# Patient Record
Sex: Female | Born: 1975 | Hispanic: No | Marital: Married | Smoking: Never smoker
Health system: Southern US, Community
[De-identification: ages and names within clinical notes are randomized; demographics above are authoritative.]

## PROBLEM LIST (undated history)

## (undated) DIAGNOSIS — E119 Type 2 diabetes mellitus without complications: Secondary | ICD-10-CM

## (undated) DIAGNOSIS — I10 Essential (primary) hypertension: Secondary | ICD-10-CM

## (undated) HISTORY — DX: Essential (primary) hypertension: I10

---

## 2001-06-22 ENCOUNTER — Other Ambulatory Visit: Admission: RE | Admit: 2001-06-22 | Discharge: 2001-06-22 | Payer: Self-pay | Admitting: Obstetrics and Gynecology

## 2001-06-25 ENCOUNTER — Ambulatory Visit (HOSPITAL_COMMUNITY): Admission: RE | Admit: 2001-06-25 | Discharge: 2001-06-25 | Payer: Self-pay | Admitting: Obstetrics and Gynecology

## 2001-06-25 ENCOUNTER — Encounter: Payer: Self-pay | Admitting: Obstetrics and Gynecology

## 2001-07-12 ENCOUNTER — Inpatient Hospital Stay (HOSPITAL_COMMUNITY): Admission: RE | Admit: 2001-07-12 | Discharge: 2001-07-14 | Payer: Self-pay | Admitting: Obstetrics and Gynecology

## 2004-12-04 ENCOUNTER — Other Ambulatory Visit: Admission: RE | Admit: 2004-12-04 | Discharge: 2004-12-04 | Payer: Self-pay | Admitting: Family Medicine

## 2011-04-22 ENCOUNTER — Ambulatory Visit: Payer: Self-pay

## 2011-04-22 DIAGNOSIS — S52123A Displaced fracture of head of unspecified radius, initial encounter for closed fracture: Secondary | ICD-10-CM

## 2011-04-22 DIAGNOSIS — R112 Nausea with vomiting, unspecified: Secondary | ICD-10-CM

## 2011-04-30 ENCOUNTER — Ambulatory Visit: Payer: Self-pay

## 2011-04-30 DIAGNOSIS — S52123A Displaced fracture of head of unspecified radius, initial encounter for closed fracture: Secondary | ICD-10-CM

## 2016-04-25 ENCOUNTER — Other Ambulatory Visit (HOSPITAL_COMMUNITY): Payer: Self-pay | Admitting: Nurse Practitioner

## 2016-04-25 DIAGNOSIS — Z1231 Encounter for screening mammogram for malignant neoplasm of breast: Secondary | ICD-10-CM

## 2016-06-23 ENCOUNTER — Ambulatory Visit (HOSPITAL_COMMUNITY)
Admission: RE | Admit: 2016-06-23 | Discharge: 2016-06-23 | Disposition: A | Discharge status: Home / Self Care | Payer: Self-pay | Source: Ambulatory Visit | Attending: Nurse Practitioner | Admitting: Nurse Practitioner

## 2016-06-23 ENCOUNTER — Encounter (HOSPITAL_COMMUNITY): Payer: Self-pay | Admitting: Radiology

## 2016-06-23 DIAGNOSIS — Z1231 Encounter for screening mammogram for malignant neoplasm of breast: Secondary | ICD-10-CM

## 2017-09-10 ENCOUNTER — Other Ambulatory Visit: Payer: Self-pay | Admitting: Nurse Practitioner

## 2017-09-10 DIAGNOSIS — Z1231 Encounter for screening mammogram for malignant neoplasm of breast: Secondary | ICD-10-CM

## 2018-09-13 ENCOUNTER — Other Ambulatory Visit (HOSPITAL_COMMUNITY): Payer: Self-pay | Admitting: Nurse Practitioner

## 2018-09-13 DIAGNOSIS — Z1231 Encounter for screening mammogram for malignant neoplasm of breast: Secondary | ICD-10-CM

## 2018-11-22 ENCOUNTER — Encounter (HOSPITAL_COMMUNITY): Payer: Self-pay

## 2018-11-22 ENCOUNTER — Ambulatory Visit (HOSPITAL_COMMUNITY)
Admission: RE | Admit: 2018-11-22 | Discharge: 2018-11-22 | Disposition: A | Discharge status: Home / Self Care | Payer: Self-pay | Source: Ambulatory Visit | Attending: Nurse Practitioner | Admitting: Nurse Practitioner

## 2018-11-22 ENCOUNTER — Other Ambulatory Visit: Payer: Self-pay

## 2018-11-22 DIAGNOSIS — Z1231 Encounter for screening mammogram for malignant neoplasm of breast: Secondary | ICD-10-CM | POA: Insufficient documentation

## 2019-12-17 ENCOUNTER — Other Ambulatory Visit: Payer: Self-pay

## 2019-12-17 ENCOUNTER — Inpatient Hospital Stay (HOSPITAL_BASED_OUTPATIENT_CLINIC_OR_DEPARTMENT_OTHER)
Admission: EM | Admit: 2019-12-17 | Discharge: 2019-12-23 | DRG: 392 | Disposition: A | Discharge status: Home / Self Care | Payer: Self-pay | Attending: Surgery | Admitting: Surgery

## 2019-12-17 ENCOUNTER — Encounter (HOSPITAL_BASED_OUTPATIENT_CLINIC_OR_DEPARTMENT_OTHER): Payer: Self-pay | Admitting: Emergency Medicine

## 2019-12-17 ENCOUNTER — Emergency Department (HOSPITAL_BASED_OUTPATIENT_CLINIC_OR_DEPARTMENT_OTHER): Payer: Self-pay

## 2019-12-17 DIAGNOSIS — Z20822 Contact with and (suspected) exposure to covid-19: Secondary | ICD-10-CM | POA: Diagnosis present

## 2019-12-17 DIAGNOSIS — E876 Hypokalemia: Secondary | ICD-10-CM | POA: Diagnosis present

## 2019-12-17 DIAGNOSIS — Z833 Family history of diabetes mellitus: Secondary | ICD-10-CM

## 2019-12-17 DIAGNOSIS — R911 Solitary pulmonary nodule: Secondary | ICD-10-CM | POA: Diagnosis present

## 2019-12-17 DIAGNOSIS — K5900 Constipation, unspecified: Secondary | ICD-10-CM | POA: Diagnosis present

## 2019-12-17 DIAGNOSIS — K5720 Diverticulitis of large intestine with perforation and abscess without bleeding: Principal | ICD-10-CM | POA: Diagnosis present

## 2019-12-17 DIAGNOSIS — K5792 Diverticulitis of intestine, part unspecified, without perforation or abscess without bleeding: Secondary | ICD-10-CM | POA: Diagnosis present

## 2019-12-17 DIAGNOSIS — E11649 Type 2 diabetes mellitus with hypoglycemia without coma: Secondary | ICD-10-CM | POA: Diagnosis not present

## 2019-12-17 DIAGNOSIS — IMO0001 Reserved for inherently not codable concepts without codable children: Secondary | ICD-10-CM | POA: Insufficient documentation

## 2019-12-17 DIAGNOSIS — Z7984 Long term (current) use of oral hypoglycemic drugs: Secondary | ICD-10-CM

## 2019-12-17 DIAGNOSIS — Z79899 Other long term (current) drug therapy: Secondary | ICD-10-CM

## 2019-12-17 HISTORY — DX: Type 2 diabetes mellitus without complications: E11.9

## 2019-12-17 LAB — COMPREHENSIVE METABOLIC PANEL
ALT: 13 U/L (ref 0–44)
AST: 11 U/L — ABNORMAL LOW (ref 15–41)
Albumin: 3.5 g/dL (ref 3.5–5.0)
Alkaline Phosphatase: 51 U/L (ref 38–126)
Anion gap: 9 (ref 5–15)
BUN: 12 mg/dL (ref 6–20)
CO2: 23 mmol/L (ref 22–32)
Calcium: 10.6 mg/dL — ABNORMAL HIGH (ref 8.9–10.3)
Chloride: 104 mmol/L (ref 98–111)
Creatinine, Ser: 0.69 mg/dL (ref 0.44–1.00)
GFR calc Af Amer: >60 mL/min (ref >60)
GFR calc non Af Amer: >60 mL/min (ref >60)
Glucose, Bld: 135 mg/dL — ABNORMAL HIGH (ref 70–99)
Potassium: 3.4 mmol/L — ABNORMAL LOW (ref 3.5–5.1)
Sodium: 136 mmol/L (ref 135–145)
Total Bilirubin: 0.4 mg/dL (ref 0.3–1.2)
Total Protein: 7.1 g/dL (ref 6.5–8.1)

## 2019-12-17 LAB — CBC
HCT: 38.1 % (ref 36.0–46.0)
Hemoglobin: 12.7 g/dL (ref 12.0–15.0)
MCH: 29.7 pg (ref 26.0–34.0)
MCHC: 33.3 g/dL (ref 30.0–36.0)
MCV: 89 fL (ref 80.0–100.0)
Platelets: 257 10*3/uL (ref 150–400)
RBC: 4.28 MIL/uL (ref 3.87–5.11)
RDW: 12.2 % (ref 11.5–15.5)
WBC: 7.4 10*3/uL (ref 4.0–10.5)
nRBC: 0 % (ref 0.0–0.2)

## 2019-12-17 LAB — URINALYSIS, ROUTINE W REFLEX MICROSCOPIC
Bilirubin Urine: NEGATIVE
Glucose, UA: NEGATIVE mg/dL
Ketones, ur: NEGATIVE mg/dL
Nitrite: NEGATIVE
Protein, ur: NEGATIVE mg/dL
Specific Gravity, Urine: 1.015 (ref 1.005–1.030)
pH: 7 (ref 5.0–8.0)

## 2019-12-17 LAB — SARS CORONAVIRUS 2 BY RT PCR (HOSPITAL ORDER, PERFORMED IN ~~LOC~~ HOSPITAL LAB): SARS Coronavirus 2: NEGATIVE

## 2019-12-17 LAB — HEMOGLOBIN A1C
Hgb A1c MFr Bld: 5.8 % — ABNORMAL HIGH (ref 4.8–5.6)
Mean Plasma Glucose: 119.76 mg/dL

## 2019-12-17 LAB — URINALYSIS, MICROSCOPIC (REFLEX)

## 2019-12-17 LAB — GLUCOSE, CAPILLARY
Glucose-Capillary: 146 mg/dL — ABNORMAL HIGH (ref 70–99)
Glucose-Capillary: 202 mg/dL — ABNORMAL HIGH (ref 70–99)
Glucose-Capillary: 100 mg/dL — ABNORMAL HIGH (ref 70–99)

## 2019-12-17 LAB — PREGNANCY, URINE: Preg Test, Ur: NEGATIVE

## 2019-12-17 LAB — LIPASE, BLOOD: Lipase: 22 U/L (ref 11–51)

## 2019-12-17 LAB — CBG MONITORING, ED: Glucose-Capillary: 122 mg/dL — ABNORMAL HIGH (ref 70–99)

## 2019-12-17 LAB — HIV ANTIBODY (ROUTINE TESTING W REFLEX): HIV Screen 4th Generation wRfx: NONREACTIVE

## 2019-12-17 MED ORDER — IOHEXOL 300 MG/ML  SOLN
100.0000 mL | Freq: Once | INTRAMUSCULAR | Status: AC | PRN
  Administered 2019-12-17: 100 mL via INTRAVENOUS

## 2019-12-17 MED ORDER — INSULIN DETEMIR 100 UNIT/ML ~~LOC~~ SOLN
5.0000 [IU] | Freq: Every day | SUBCUTANEOUS | Status: DC
  Administered 2019-12-17 – 2019-12-22 (×4): 5 [IU] via SUBCUTANEOUS
  Filled 2019-12-17 (×6): qty 0.05

## 2019-12-17 MED ORDER — ERTAPENEM SODIUM 1 G IJ SOLR: 1.0000 g | INTRAMUSCULAR | Status: DC

## 2019-12-17 MED ORDER — SODIUM CHLORIDE 0.9 % IV BOLUS
1000.0000 mL | Freq: Once | INTRAVENOUS | Status: AC
  Administered 2019-12-17: 1000 mL via INTRAVENOUS

## 2019-12-17 MED ORDER — ACETAMINOPHEN 650 MG RE SUPP: 650.0000 mg | Freq: Four times a day (QID) | RECTAL | Status: DC | PRN

## 2019-12-17 MED ORDER — ENOXAPARIN SODIUM 40 MG/0.4ML ~~LOC~~ SOLN: 40.0000 mg | SUBCUTANEOUS | Status: DC

## 2019-12-17 MED ORDER — ONDANSETRON HCL 4 MG/2ML IJ SOLN
4.0000 mg | Freq: Once | INTRAMUSCULAR | Status: AC
  Administered 2019-12-17: 4 mg via INTRAVENOUS
  Filled 2019-12-17: qty 2

## 2019-12-17 MED ORDER — ONDANSETRON HCL 4 MG PO TABS: 4.0000 mg | ORAL_TABLET | Freq: Four times a day (QID) | ORAL | Status: DC | PRN

## 2019-12-17 MED ORDER — ACETAMINOPHEN 325 MG PO TABS
650.0000 mg | ORAL_TABLET | Freq: Four times a day (QID) | ORAL | Status: DC | PRN
  Administered 2019-12-17 – 2019-12-20 (×3): 650 mg via ORAL
  Filled 2019-12-17 (×3): qty 2

## 2019-12-17 MED ORDER — MORPHINE SULFATE (PF) 4 MG/ML IV SOLN
4.0000 mg | Freq: Once | INTRAVENOUS | Status: AC
  Administered 2019-12-17: 4 mg via INTRAVENOUS
  Filled 2019-12-17: qty 1

## 2019-12-17 MED ORDER — ONDANSETRON HCL 4 MG/2ML IJ SOLN: 4.0000 mg | Freq: Four times a day (QID) | INTRAMUSCULAR | Status: DC | PRN

## 2019-12-17 MED ORDER — METRONIDAZOLE IN NACL 5-0.79 MG/ML-% IV SOLN
500.0000 mg | Freq: Once | INTRAVENOUS | Status: AC
  Administered 2019-12-17: 500 mg via INTRAVENOUS
  Filled 2019-12-17: qty 100

## 2019-12-17 MED ORDER — PNEUMOCOCCAL VAC POLYVALENT 25 MCG/0.5ML IJ INJ
0.5000 mL | INJECTION | INTRAMUSCULAR | Status: DC
  Filled 2019-12-17: qty 0.5

## 2019-12-17 MED ORDER — SODIUM CHLORIDE 0.9 % IV BOLUS
2000.0000 mL | Freq: Once | INTRAVENOUS | Status: AC
  Administered 2019-12-17: 11:00:00 2000 mL via INTRAVENOUS

## 2019-12-17 MED ORDER — HYDROMORPHONE HCL 1 MG/ML IJ SOLN
1.0000 mg | INTRAMUSCULAR | Status: DC | PRN
  Administered 2019-12-17: 11:00:00 1 mg via INTRAVENOUS
  Administered 2019-12-17: 19:00:00 0.5 mg via INTRAVENOUS
  Administered 2019-12-18 – 2019-12-19 (×5): 1 mg via INTRAVENOUS
  Filled 2019-12-17 (×8): qty 1

## 2019-12-17 MED ORDER — INSULIN ASPART 100 UNIT/ML ~~LOC~~ SOLN
0.0000 [IU] | Freq: Three times a day (TID) | SUBCUTANEOUS | Status: DC
  Administered 2019-12-17: 1 [IU] via SUBCUTANEOUS
  Administered 2019-12-17: 3 [IU] via SUBCUTANEOUS
  Administered 2019-12-18: 12:00:00 2 [IU] via SUBCUTANEOUS
  Administered 2019-12-18: 09:00:00 1 [IU] via SUBCUTANEOUS
  Administered 2019-12-18 – 2019-12-22 (×2): 2 [IU] via SUBCUTANEOUS

## 2019-12-17 MED ORDER — SODIUM CHLORIDE 0.9 % IV SOLN
1.0000 g | INTRAVENOUS | Status: DC
  Administered 2019-12-17 – 2019-12-18 (×2): 1000 mg via INTRAVENOUS
  Filled 2019-12-17 (×3): qty 1

## 2019-12-17 MED ORDER — INSULIN ASPART 100 UNIT/ML ~~LOC~~ SOLN
3.0000 [IU] | Freq: Three times a day (TID) | SUBCUTANEOUS | Status: DC
  Administered 2019-12-18 (×3): 3 [IU] via SUBCUTANEOUS

## 2019-12-17 MED ORDER — KCL IN DEXTROSE-NACL 30-5-0.45 MEQ/L-%-% IV SOLN
INTRAVENOUS | Status: AC
  Administered 2019-12-17: 13:00:00 100 mL/h via INTRAVENOUS
  Filled 2019-12-17 (×2): qty 1000

## 2019-12-17 MED ORDER — CIPROFLOXACIN IN D5W 400 MG/200ML IV SOLN
400.0000 mg | Freq: Once | INTRAVENOUS | Status: AC
  Administered 2019-12-17: 400 mg via INTRAVENOUS
  Filled 2019-12-17: qty 200

## 2019-12-17 NOTE — ED Provider Notes (Signed)
[facility] Provider Note   CSN: [id number] Arrival date & time: 12/17/19  0159   History Chief Complaint  Patient presents with  . Abdominal Pain    Tiffany Stevenson is a 44 y.o. female.  The history is provided by the patient.  Abdominal Pain She has history of diabetes and comes in complaining of abdominal pain for the last 2 days.  Pain is predominantly right-sided and without radiation.  She rates it at 9/10.  Nothing makes it better, nothing makes it worse.  She denies nausea or vomiting but has had anorexia.  She denies any urinary difficulty.  She has been generally constipated and has been taking MiraLAX without any benefit.  She denies fever, chills, sweats.  Past Medical History:  Diagnosis Date  . Diabetes mellitus without complication ([place])     There are no problems to display for this patient.   History reviewed. No pertinent surgical history.   OB History   No obstetric history on file.     No family history on file.  Social History   Tobacco Use  . Smoking status: Never Smoker  . Smokeless tobacco: Never Used  Substance Use Topics  . Alcohol use: Yes    Comment: occasionally  . Drug use: Never    Home Medications Prior to Admission medications   Medication Sig Start Date End Date Taking? Authorizing Provider  lisinopril (ZESTRIL) 10 MG tablet Take 10 mg by mouth daily.   Yes [provider]  metFORMIN (GLUCOPHAGE) 500 MG tablet Take 500 mg by mouth 2 (two) times daily with a meal.   Yes [provider]    Allergies    Patient has no known allergies.  Review of Systems   Review of Systems  Gastrointestinal: Positive for abdominal pain.  All other systems reviewed and are negative.   Physical Exam Updated Vital Signs BP 121/63 (BP Location: Right Arm)   Pulse 89   Temp 98.4 F (36.9 C) (Oral)   Resp (!) 22   Ht 5\' 4"  (1.626 m)   Wt 64.9 kg   SpO2 99%   BMI 24.55 kg/m    Physical Exam Vitals and nursing note reviewed.   44 year old female, appears uncomfortable, but is in no acute distress. Vital signs are significant for slightly elevated respiratory rate. Oxygen saturation is 99%, which is normal. Head is normocephalic and atraumatic. PERRLA, EOMI. Oropharynx is clear. Neck is nontender and supple without adenopathy or JVD. Back is nontender and there is no CVA tenderness. Lungs are clear without rales, wheezes, or rhonchi. Chest is nontender. Heart has regular rate and rhythm without murmur. Abdomen is soft, flat, with moderate epigastric and right lower quadrant and suprapubic tenderness.  There is no rebound or guarding.  There are no masses or hepatosplenomegaly and peristalsis is hypoactive. Extremities have no cyanosis or edema, full range of motion is present. Skin is warm and dry without rash. Neurologic: Mental status is normal, cranial nerves are intact, there are no motor or sensory deficits.  ED Results / Procedures / Treatments   Labs (all labs ordered are listed, but only abnormal results are displayed) Labs Reviewed  COMPREHENSIVE METABOLIC PANEL - Abnormal; Notable for the following components:      Result Value   Potassium 3.4 (*)    Glucose, Bld 135 (*)    Calcium 10.6 (*)    AST 11 (*)    All other components within normal limits  URINALYSIS,  ROUTINE W REFLEX MICROSCOPIC - Abnormal; Notable for the following components:   APPearance CLOUDY (*)    Hgb urine dipstick MODERATE (*)    Leukocytes,Ua SMALL (*)    All other components within normal limits  URINALYSIS, MICROSCOPIC (REFLEX) - Abnormal; Notable for the following components:   Bacteria, UA FEW (*)    All other components within normal limits  CBG MONITORING, ED - Abnormal; Notable for the following components:   Glucose-Capillary 122 (*)    All other components within normal limits  LIPASE, BLOOD  CBC  PREGNANCY, URINE   Radiology CT ABDOMEN PELVIS W  CONTRAST  Result Date: 12/17/2019 CLINICAL DATA:  Initial evaluation for acute right lower quadrant abdominal pain. EXAM: CT ABDOMEN AND PELVIS WITH CONTRAST TECHNIQUE: Multidetector CT imaging of the abdomen and pelvis was performed using the standard protocol following bolus administration of intravenous contrast. CONTRAST:  169mL OMNIPAQUE IOHEXOL 300 MG/ML  SOLN COMPARISON:  None available. FINDINGS: Lower chest: 3 mm nodule partially visualized at the left lower lobe (series 3, image 1), indeterminate. Visualized lungs are otherwise clear. Hepatobiliary: Liver within normal limits. Focal fat deposition noted adjacent to the falciform ligament. Gallbladder normal. No biliary dilatation. Pancreas: Pancreas within normal limits. Spleen: Spleen within normal limits. Adrenals/Urinary Tract: Adrenal glands are normal. Kidneys equal size with symmetric enhancement. No nephrolithiasis, hydronephrosis, or focal enhancing renal mass. No visible hydroureter. Partially distended bladder within normal limits. Stomach/Bowel: Stomach within normal limits. No evidence for bowel obstruction. Appendix well visualized within the right lower quadrant and is within normal limits for caliber without evidence for acute appendicitis. Multiple scattered colonic diverticula seen, most pronounced at the sigmoid colon. Associated wall thickening with inflammatory stranding about the sigmoid colon consistent with acute diverticulitis. Associated inflammatory stranding throughout the pericolonic and omental fat noted within the lower abdomen. Small volume free fluid within the pelvis, likely reactive. Few locules of free air seen within the lower abdomen/pelvis along the midline (series 2, image 71, 65 - series 5, image 22 on coronal sequence), suggestive of a micro perforation. Site of perforation not definitely seen. Vascular/Lymphatic: Normal intravascular enhancement seen throughout the intra-abdominal aorta. Mesenteric vessels patent  proximally. No pathologically enlarged intra-abdominopelvic lymph nodes. Reproductive: IUD in appropriate position within the uterus. Mild prominence of the parametrial vasculature noted. Ovaries within normal limits for age. Other: Small fat containing paraumbilical hernia noted without associated inflammation. Musculoskeletal: External soft tissues within normal limits. No acute osseous abnormality. No discrete or worrisome osseous lesions. IMPRESSION: 1. Findings consistent with acute sigmoid diverticulitis. Few locules of free air within the lower abdomen/pelvis consistent with associated micro perforation. 2. No other acute intra-abdominal or pelvic process identified. Normal appendix. 3. 3 mm left lower lobe pulmonary nodule, indeterminate. No follow-up needed if patient is low-risk. Non-contrast chest CT can be considered in 12 months if patient is high-risk. This recommendation follows the consensus statement: Guidelines for Management of Incidental Pulmonary Nodules Detected on CT Images: From the Fleischner Society 2017; Radiology 2017; 284:228-243. Critical Value/emergent results were called by telephone at the time of interpretation on 12/17/2019 at 5:13 am to provider Rukiya Hodgkins [facility] , who verbally acknowledged these results. Electronically Signed   By: Jeannine Boga M.D.   On: 12/17/2019 05:15    Procedures Procedures   Medications Ordered in ED Medications  ciprofloxacin (CIPRO) IVPB 400 mg (has no administration in time range)  metroNIDAZOLE (FLAGYL) IVPB 500 mg (has no administration in time range)  morphine 4 MG/ML injection 4 mg (4  mg Intravenous Given 12/17/19 0433)  ondansetron (ZOFRAN) injection 4 mg (4 mg Intravenous Given 12/17/19 0433)  sodium chloride 0.9 % bolus 1,000 mL (1,000 mLs Intravenous New Bag/Given 12/17/19 0500)  iohexol (OMNIPAQUE) 300 MG/ML solution 100 mL (100 mLs Intravenous Contrast Given 12/17/19 0440)    ED Course  I have reviewed the triage vital signs and  the nursing notes.  Pertinent labs & imaging results that were available during my care of the patient were reviewed by me and considered in my medical decision making (see chart for details).  MDM Rules/Calculators/A&P Abdominal pain of uncertain cause.  Differential is broad and does include conditions were significant morbidity and mortality.  Differential includes diverticulitis, appendicitis, pancreatitis, pain from constipation.  Screening labs are unremarkable.  Old records are reviewed, and she has no relevant past visits.  Will send for CT of abdomen and pelvis to rule out serious pathology.  CT scan shows sigmoid diverticulitis with microperforation.  I have independently viewed the CT images and discussed the findings with radiologist.  She is started on ciprofloxacin and metronidazole.  Also, incidental finding of left lower lobe lung nodule.  Case is discussed with Dr. Myna Hidalgo of [facility], who agrees to admit the patient.  Final Clinical Impression(s) / ED Diagnoses Final diagnoses:  Perforation of sigmoid colon due to diverticulitis  Nodule of lower lobe of left lung    Rx / DC Orders ED Discharge Orders    None       Delora Fuel, MD 85/27/78 [phone]

## 2019-12-17 NOTE — ED Triage Notes (Signed)
Reports diffuse abdominal pain since yesterday.  Endorses difficulty having a bm.  Last one a week or so ago.  Denies any n/v.

## 2019-12-17 NOTE — H&P (Addendum)
History and Physical  Tiffany Stevenson BMW:[id number] DOB: [birth year] DOA: 12/17/2019  PCP: Karma Ganja, NP Patient coming from: Home  I have personally briefly reviewed patient's old medical records in [place]   Chief Complaint: abd pain nause  HPI: Tiffany Stevenson is a 44 y.o. female past medical history of diabetes mellitus with an unknown hemoglobin A1c, is in complaining of abdominal pain that started 2 days prior to admission mainly in the right side without radiation, on my interview she relates it is more periumbilical with nausea nothing makes it better or worse.  She relates her abdominal pain is constant, denies any fever at home, urinary difficulties, or vaginal discharge she is anorexic.  She denies any fever chills vomiting or diarrhea.  In the ED: To be afebrile with vital stable is mildly hypokalemic UA showing she had a white blood cells, CT scan showed diverticulitis with microperforation.   Review of Systems: All systems reviewed and apart from history of presenting illness, are negative.  Past Medical History:  Diagnosis Date  . Diabetes mellitus without complication ([place])    History reviewed. No pertinent surgical history. Social History:  reports that she has never smoked. She has never used smokeless tobacco. She reports previous alcohol use. She reports that she does not use drugs.   No Known Allergies  Family History  Problem Relation Age of Onset  . Diabetes Mother   . Diabetes Father       Prior to Admission medications   Medication Sig Start Date End Date Taking? Authorizing Provider  lisinopril (ZESTRIL) 10 MG tablet Take 10 mg by mouth daily.   Yes [provider]  metFORMIN (GLUCOPHAGE) 500 MG tablet Take 500 mg by mouth 2 (two) times daily with a meal.   Yes [provider]   Physical Exam: Vitals:   12/17/19 0359 12/17/19 0546 12/17/19 0600 12/17/19 0850  BP: 121/63 116/67 114/61 (!) 105/53    Pulse: 89 98 88 87  Resp: (!) 22 18 18 15   Temp: 98.4 F (36.9 C)   99.2 F (37.3 C)  TempSrc: Oral   Oral  SpO2: 99% 100% 100% 98%  Weight:      Height:         General exam: Moderately built and nourished patient, lying comfortably supine on the gurney in no obvious distress.  Head, eyes and ENT: Nontraumatic and normocephalic. Pupils equally reacting to light and accommodation. Oral mucosa moist.  Neck: Supple. No JVD, carotid bruit or thyromegaly.  Lymphatics: No lymphadenopathy.  Respiratory system: Clear to auscultation. No increased work of breathing.  Cardiovascular system: S1 and S2 heard, RRR. No JVD, murmurs, gallops, clicks or pedal edema.  Gastrointestinal system: Positive bowel sounds soft diffuse tenderness but mainly in the hydrant no rebound or guarding.  Central nervous system: Alert and oriented. No focal neurological deficits.  Extremities: Symmetric 5 x 5 power. Peripheral pulses symmetrically felt.   Skin: No rashes or acute findings.  Musculoskeletal system: Negative exam.  Psychiatry: Pleasant and cooperative.   Labs on Admission:  Basic Metabolic Panel: Recent Labs  Lab 12/17/19 0219  NA 136  K 3.4*  CL 104  CO2 23  GLUCOSE 135*  BUN 12  CREATININE 0.69  CALCIUM 10.6*   Liver Function Tests: Recent Labs  Lab 12/17/19 0219  AST 11*  ALT 13  ALKPHOS 51  BILITOT 0.4  PROT 7.1  ALBUMIN 3.5   Recent Labs  Lab 12/17/19 0219  LIPASE 22   No results for input(s): AMMONIA in the last 168 hours. CBC: Recent Labs  Lab 12/17/19 0219  WBC 7.4  HGB 12.7  HCT 38.1  MCV 89.0  PLT 257   Cardiac Enzymes: No results for input(s): CKTOTAL, CKMB, CKMBINDEX, TROPONINI in the last 168 hours.  BNP (last 3 results) No results for input(s): PROBNP in the last 8760 hours. CBG: Recent Labs  Lab 12/17/19 0226  GLUCAP 122*    Radiological Exams on Admission: CT ABDOMEN PELVIS W CONTRAST  Result Date: 12/17/2019 CLINICAL DATA:   Initial evaluation for acute right lower quadrant abdominal pain. EXAM: CT ABDOMEN AND PELVIS WITH CONTRAST TECHNIQUE: Multidetector CT imaging of the abdomen and pelvis was performed using the standard protocol following bolus administration of intravenous contrast. CONTRAST:  131mL OMNIPAQUE IOHEXOL 300 MG/ML  SOLN COMPARISON:  None available. FINDINGS: Lower chest: 3 mm nodule partially visualized at the left lower lobe (series 3, image 1), indeterminate. Visualized lungs are otherwise clear. Hepatobiliary: Liver within normal limits. Focal fat deposition noted adjacent to the falciform ligament. Gallbladder normal. No biliary dilatation. Pancreas: Pancreas within normal limits. Spleen: Spleen within normal limits. Adrenals/Urinary Tract: Adrenal glands are normal. Kidneys equal size with symmetric enhancement. No nephrolithiasis, hydronephrosis, or focal enhancing renal mass. No visible hydroureter. Partially distended bladder within normal limits. Stomach/Bowel: Stomach within normal limits. No evidence for bowel obstruction. Appendix well visualized within the right lower quadrant and is within normal limits for caliber without evidence for acute appendicitis. Multiple scattered colonic diverticula seen, most pronounced at the sigmoid colon. Associated wall thickening with inflammatory stranding about the sigmoid colon consistent with acute diverticulitis. Associated inflammatory stranding throughout the pericolonic and omental fat noted within the lower abdomen. Small volume free fluid within the pelvis, likely reactive. Few locules of free air seen within the lower abdomen/pelvis along the midline (series 2, image 71, 65 - series 5, image 22 on coronal sequence), suggestive of a micro perforation. Site of perforation not definitely seen. Vascular/Lymphatic: Normal intravascular enhancement seen throughout the intra-abdominal aorta. Mesenteric vessels patent proximally. No pathologically enlarged  intra-abdominopelvic lymph nodes. Reproductive: IUD in appropriate position within the uterus. Mild prominence of the parametrial vasculature noted. Ovaries within normal limits for age. Other: Small fat containing paraumbilical hernia noted without associated inflammation. Musculoskeletal: External soft tissues within normal limits. No acute osseous abnormality. No discrete or worrisome osseous lesions. IMPRESSION: 1. Findings consistent with acute sigmoid diverticulitis. Few locules of free air within the lower abdomen/pelvis consistent with associated micro perforation. 2. No other acute intra-abdominal or pelvic process identified. Normal appendix. 3. 3 mm left lower lobe pulmonary nodule, indeterminate. No follow-up needed if patient is low-risk. Non-contrast chest CT can be considered in 12 months if patient is high-risk. This recommendation follows the consensus statement: Guidelines for Management of Incidental Pulmonary Nodules Detected on CT Images: From the Fleischner Society 2017; Radiology 2017; 284:228-243. Critical Value/emergent results were called by telephone at the time of interpretation on 12/17/2019 at 5:13 am to provider DAVID [facility] , who verbally acknowledged these results. Electronically Signed   By: Jeannine Boga M.D.   On: 12/17/2019 05:15    EKG: Independently reviewed.  None  Assessment/Plan Active Problems:   Acute diverticulitis Her clinical picture is deceiving, as she has a very significant diverticulitis with microperforation on CT scan with significant abdominal pain, but she is afebrile not tachycardic and other vital signs are stable. I will go ahead and start her on Invanz, we will  hydrate her aggressively we will give her 2 L of normal saline and then continue D5 half-normal saline infusion, she is in extreme pain. We will start her on IV hydromorphone, if her symptomatology does not improve over the next several days we will have to repeat a CT scan. At this  point in time she is thirsty and not hungry will allow her clear liquid diet. We will monitor CBCs and basic metabolic panel closely.  Pulmonary nodule 3 mm: She will need further evaluation in 4 to 6 weeks with PCP to review the CT scan of the chest.    DVT Prophylaxis: lovenox Code Status: full  Family Communication: Daughter-in-law Disposition Plan: inpatient      It is my clinical opinion that admission to INPATIENT is reasonable and necessary in this 44 y.o. female diabetes mellitus comes in with acute diverticulitis with severe abdominal pain and nausea not able to tolerate anything by mouth, started on IV fluids and IV Invanz.  Given the aforementioned, the predictability of an adverse outcome is felt to be significant. I expect that the patient will require at least 2 midnights in the hospital to treat this condition.  Charlynne Cousins MD [facility]   12/17/2019, 10:19 AM

## 2019-12-18 LAB — CBC
HCT: 31.3 % — ABNORMAL LOW (ref 36.0–46.0)
Hemoglobin: 10.1 g/dL — ABNORMAL LOW (ref 12.0–15.0)
MCH: 30.3 pg (ref 26.0–34.0)
MCHC: 32.3 g/dL (ref 30.0–36.0)
MCV: 94 fL (ref 80.0–100.0)
Platelets: 188 10*3/uL (ref 150–400)
RBC: 3.33 MIL/uL — ABNORMAL LOW (ref 3.87–5.11)
RDW: 12.5 % (ref 11.5–15.5)
WBC: 11.5 10*3/uL — ABNORMAL HIGH (ref 4.0–10.5)
nRBC: 0 % (ref 0.0–0.2)

## 2019-12-18 LAB — BASIC METABOLIC PANEL
Anion gap: 6 (ref 5–15)
BUN: <5 mg/dL — ABNORMAL LOW (ref 6–20)
CO2: 20 mmol/L — ABNORMAL LOW (ref 22–32)
Calcium: 9.1 mg/dL (ref 8.9–10.3)
Chloride: 109 mmol/L (ref 98–111)
Creatinine, Ser: 0.55 mg/dL (ref 0.44–1.00)
GFR calc Af Amer: >60 mL/min (ref >60)
GFR calc non Af Amer: >60 mL/min (ref >60)
Glucose, Bld: 156 mg/dL — ABNORMAL HIGH (ref 70–99)
Potassium: 4.1 mmol/L (ref 3.5–5.1)
Sodium: 135 mmol/L (ref 135–145)

## 2019-12-18 LAB — GLUCOSE, CAPILLARY
Glucose-Capillary: 140 mg/dL — ABNORMAL HIGH (ref 70–99)
Glucose-Capillary: 162 mg/dL — ABNORMAL HIGH (ref 70–99)
Glucose-Capillary: 190 mg/dL — ABNORMAL HIGH (ref 70–99)
Glucose-Capillary: 144 mg/dL — ABNORMAL HIGH (ref 70–99)

## 2019-12-18 MED ORDER — SODIUM CHLORIDE 0.9 % IV SOLN: INTRAVENOUS | Status: DC

## 2019-12-18 MED ORDER — KCL IN DEXTROSE-NACL 30-5-0.45 MEQ/L-%-% IV SOLN
INTRAVENOUS | Status: DC
  Filled 2019-12-18: qty 1000

## 2019-12-18 MED ORDER — ENOXAPARIN SODIUM 40 MG/0.4ML ~~LOC~~ SOLN
40.0000 mg | Freq: Every day | SUBCUTANEOUS | Status: DC
  Administered 2019-12-18 – 2019-12-22 (×5): 40 mg via SUBCUTANEOUS
  Filled 2019-12-18 (×4): qty 0.4

## 2019-12-18 NOTE — Consult Note (Signed)
Reason for Consult:diverticulitis with microperf Referring Physician: Dr. Angela Burke Tearah Saulsbury is an 44 y.o. female.  HPI: This is a 44 year old female with no prior history of diverticulitis who presents with a several day history of right lower quadrant abdominal pain.  She reports that the pain came on gradually and is now sharp and moderate.  She presented to the emergency department.  She was found on CT scan to have findings consistent with sigmoid diverticulitis and a microperforation.  There were a few locules of free air but no abscess.  She reports that she has been constipated.  She denies fevers or chills.  She denies dysuria or hematuria.  Past Medical History:  Diagnosis Date  . Diabetes mellitus without complication ([place])     History reviewed. No pertinent surgical history.  Family History  Problem Relation Age of Onset  . Diabetes Mother   . Diabetes Father     Social History:  reports that she has never smoked. She has never used smokeless tobacco. She reports previous alcohol use. She reports that she does not use drugs.  Allergies: No Known Allergies  Medications: I have reviewed the patient's current medications.  Results for orders placed or performed during the hospital encounter of 12/17/19 (from the past 48 hour(s))  Lipase, blood     Status: None   Collection Time: 12/17/19  2:19 AM  Result Value Ref Range   Lipase 22 11 - 51 U/L    Comment: Performed at [facility], [address] [place], [place] [address]  Comprehensive metabolic panel     Status: Abnormal   Collection Time: 12/17/19  2:19 AM  Result Value Ref Range   Sodium 136 135 - 145 mmol/L   Potassium 3.4 (L) 3.5 - 5.1 mmol/L   Chloride 104 98 - 111 mmol/L   CO2 23 22 - 32 mmol/L   Glucose, Bld 135 (H) 70 - 99 mg/dL    Comment: Glucose reference range applies only to samples taken after fasting for at least 8 hours.   BUN 12 6 - 20 mg/dL   Creatinine, Ser 0.69 0.44  - 1.00 mg/dL   Calcium 10.6 (H) 8.9 - 10.3 mg/dL   Total Protein 7.1 6.5 - 8.1 g/dL   Albumin 3.5 3.5 - 5.0 g/dL   AST 11 (L) 15 - 41 U/L   ALT 13 0 - 44 U/L   Alkaline Phosphatase 51 38 - 126 U/L   Total Bilirubin 0.4 0.3 - 1.2 mg/dL   GFR calc non Af Amer >60 >60 mL/min   GFR calc Af Amer >60 >60 mL/min   Anion gap 9 5 - 15    Comment: Performed at [facility], [address] [place], [place] [address]  CBC     Status: None   Collection Time: 12/17/19  2:19 AM  Result Value Ref Range   WBC 7.4 4.0 - 10.5 K/uL   RBC 4.28 3.87 - 5.11 MIL/uL   Hemoglobin 12.7 12.0 - 15.0 g/dL   HCT 38.1 36 - 46 %   MCV 89.0 80.0 - 100.0 fL   MCH 29.7 26.0 - 34.0 pg   MCHC 33.3 30.0 - 36.0 g/dL   RDW 12.2 11.5 - 15.5 %   Platelets 257 150 - 400 K/uL   nRBC 0.0 0.0 - 0.2 %    Comment: Performed at [facility], [address] [place], [place] [address]  Urinalysis, Routine  w reflex microscopic     Status: Abnormal   Collection Time: 12/17/19  2:19 AM  Result Value Ref Range   Color, Urine YELLOW YELLOW   APPearance CLOUDY (A) CLEAR   Specific Gravity, Urine 1.015 1.005 - 1.030   pH 7.0 5.0 - 8.0   Glucose, UA NEGATIVE NEGATIVE mg/dL   Hgb urine dipstick MODERATE (A) NEGATIVE   Bilirubin Urine NEGATIVE NEGATIVE   Ketones, ur NEGATIVE NEGATIVE mg/dL   Protein, ur NEGATIVE NEGATIVE mg/dL   Nitrite NEGATIVE NEGATIVE   Leukocytes,Ua SMALL (A) NEGATIVE    Comment: Performed at [facility], [address] [place], [place] [address]  Pregnancy, urine     Status: None   Collection Time: 12/17/19  2:19 AM  Result Value Ref Range   Preg Test, Ur NEGATIVE NEGATIVE    Comment:        THE SENSITIVITY OF THIS METHODOLOGY IS >20 mIU/mL. Performed at [facility], [address] [place], [place] [address]   Urinalysis, Microscopic (reflex)     Status: Abnormal   Collection Time: 12/17/19  2:19 AM  Result Value Ref Range   RBC / HPF 0-5 0 - 5 RBC/hpf    WBC, UA 0-5 0 - 5 WBC/hpf   Bacteria, UA FEW (A) NONE SEEN   Squamous Epithelial / LPF 0-5 0 - 5   Mucus PRESENT    Amorphous Crystal PRESENT     Comment: Performed at [facility], [address] [place], [place] [address]  CBG monitoring, ED     Status: Abnormal   Collection Time: 12/17/19  2:26 AM  Result Value Ref Range   Glucose-Capillary 122 (H) 70 - 99 mg/dL    Comment: Glucose reference range applies only to samples taken after fasting for at least 8 hours.  SARS Coronavirus 2 by RT PCR (hospital order, performed in [facility] hospital lab) Nasopharyngeal Nasopharyngeal Swab     Status: None   Collection Time: 12/17/19  5:47 AM   Specimen: Nasopharyngeal Swab  Result Value Ref Range   SARS Coronavirus 2 NEGATIVE NEGATIVE    Comment: (NOTE) SARS-CoV-2 target nucleic acids are NOT DETECTED.  The SARS-CoV-2 RNA is generally detectable in upper and lower respiratory specimens during the acute phase of infection. The lowest concentration of SARS-CoV-2 viral copies this assay can detect is 250 copies / mL. A negative result does not preclude SARS-CoV-2 infection and should not be used as the sole basis for treatment or other patient management decisions.  A negative result may occur with improper specimen collection / handling, submission of specimen other than nasopharyngeal swab, presence of viral mutation(s) within the areas targeted by this assay, and inadequate number of viral copies (<250 copies / mL). A negative result must be combined with clinical observations, patient history, and epidemiological information.  Fact Sheet for Patients:   StrictlyIdeas.no  Fact Sheet for Healthcare Providers: BankingDealers.co.za  This test is not yet approved or  cleared by the [place] FDA and has been authorized for detection and/or diagnosis of SARS-CoV-2 by FDA under an Emergency Use Authorization (EUA).  This EUA  will remain in effect (meaning this test can be used) for the duration of the COVID-19 declaration under Section 564(b)(1) of the Act, 21 U.S.C. section 360bbb-3(b)(1), unless the authorization is terminated or revoked sooner.  Performed at [facility], [address] [place], [place] [address]   HIV Antibody (routine testing  w rflx)     Status: None   Collection Time: 12/17/19 11:09 AM  Result Value Ref Range   HIV Screen 4th Generation wRfx Non Reactive Non Reactive    Comment: Performed at [facility], [address], [place], [place] [address]  Hemoglobin A1c     Status: Abnormal   Collection Time: 12/17/19 11:09 AM  Result Value Ref Range   Hgb A1c MFr Bld 5.8 (H) 4.8 - 5.6 %    Comment: (NOTE) Pre diabetes:          5.7%-6.4%  Diabetes:              >6.4%  Glycemic control for   <7.0% adults with diabetes    Mean Plasma Glucose 119.76 mg/dL    Comment: Performed at [facility] [address], [place], [place] [address]  Glucose, capillary     Status: Abnormal   Collection Time: 12/17/19 11:47 AM  Result Value Ref Range   Glucose-Capillary 146 (H) 70 - 99 mg/dL    Comment: Glucose reference range applies only to samples taken after fasting for at least 8 hours.  Glucose, capillary     Status: Abnormal   Collection Time: 12/17/19  4:15 PM  Result Value Ref Range   Glucose-Capillary 202 (H) 70 - 99 mg/dL    Comment: Glucose reference range applies only to samples taken after fasting for at least 8 hours.  Glucose, capillary     Status: Abnormal   Collection Time: 12/17/19  8:18 PM  Result Value Ref Range   Glucose-Capillary 100 (H) 70 - 99 mg/dL    Comment: Glucose reference range applies only to samples taken after fasting for at least 8 hours.  CBC     Status: Abnormal   Collection Time: 12/18/19  5:24 AM  Result Value Ref Range   WBC 11.5 (H) 4.0 - 10.5 K/uL   RBC 3.33 (L) 3.87 - 5.11 MIL/uL   Hemoglobin 10.1 (L) 12.0 - 15.0 g/dL    HCT 31.3 (L) 36 - 46 %   MCV 94.0 80.0 - 100.0 fL   MCH 30.3 26.0 - 34.0 pg   MCHC 32.3 30.0 - 36.0 g/dL   RDW 12.5 11.5 - 15.5 %   Platelets 188 150 - 400 K/uL   nRBC 0.0 0.0 - 0.2 %    Comment: Performed at [facility], [place] [address], [place], [place] [address]  Basic metabolic panel     Status: Abnormal   Collection Time: 12/18/19  5:24 AM  Result Value Ref Range   Sodium 135 135 - 145 mmol/L   Potassium 4.1 3.5 - 5.1 mmol/L   Chloride 109 98 - 111 mmol/L   CO2 20 (L) 22 - 32 mmol/L   Glucose, Bld 156 (H) 70 - 99 mg/dL    Comment: Glucose reference range applies only to samples taken after fasting for at least 8 hours.   BUN <5 (L) 6 - 20 mg/dL   Creatinine, Ser 0.55 0.44 - 1.00 mg/dL   Calcium 9.1 8.9 - 10.3 mg/dL   GFR calc non Af Amer >60 >60 mL/min   GFR calc Af Amer >60 >60 mL/min   Anion gap 6 5 - 15    Comment: Performed at [facility], [place] [address], [place], [place] [address]  Glucose, capillary     Status: Abnormal   Collection Time: 12/18/19  7:42 AM  Result Value Ref Range  Glucose-Capillary 140 (H) 70 - 99 mg/dL    Comment: Glucose reference range applies only to samples taken after fasting for at least 8 hours.    CT ABDOMEN PELVIS W CONTRAST  Result Date: 12/17/2019 CLINICAL DATA:  Initial evaluation for acute right lower quadrant abdominal pain. EXAM: CT ABDOMEN AND PELVIS WITH CONTRAST TECHNIQUE: Multidetector CT imaging of the abdomen and pelvis was performed using the standard protocol following bolus administration of intravenous contrast. CONTRAST:  163mL OMNIPAQUE IOHEXOL 300 MG/ML  SOLN COMPARISON:  None available. FINDINGS: Lower chest: 3 mm nodule partially visualized at the left lower lobe (series 3, image 1), indeterminate. Visualized lungs are otherwise clear. Hepatobiliary: Liver within normal limits. Focal fat deposition noted adjacent to the falciform ligament. Gallbladder normal. No biliary dilatation.  Pancreas: Pancreas within normal limits. Spleen: Spleen within normal limits. Adrenals/Urinary Tract: Adrenal glands are normal. Kidneys equal size with symmetric enhancement. No nephrolithiasis, hydronephrosis, or focal enhancing renal mass. No visible hydroureter. Partially distended bladder within normal limits. Stomach/Bowel: Stomach within normal limits. No evidence for bowel obstruction. Appendix well visualized within the right lower quadrant and is within normal limits for caliber without evidence for acute appendicitis. Multiple scattered colonic diverticula seen, most pronounced at the sigmoid colon. Associated wall thickening with inflammatory stranding about the sigmoid colon consistent with acute diverticulitis. Associated inflammatory stranding throughout the pericolonic and omental fat noted within the lower abdomen. Small volume free fluid within the pelvis, likely reactive. Few locules of free air seen within the lower abdomen/pelvis along the midline (series 2, image 71, 65 - series 5, image 22 on coronal sequence), suggestive of a micro perforation. Site of perforation not definitely seen. Vascular/Lymphatic: Normal intravascular enhancement seen throughout the intra-abdominal aorta. Mesenteric vessels patent proximally. No pathologically enlarged intra-abdominopelvic lymph nodes. Reproductive: IUD in appropriate position within the uterus. Mild prominence of the parametrial vasculature noted. Ovaries within normal limits for age. Other: Small fat containing paraumbilical hernia noted without associated inflammation. Musculoskeletal: External soft tissues within normal limits. No acute osseous abnormality. No discrete or worrisome osseous lesions. IMPRESSION: 1. Findings consistent with acute sigmoid diverticulitis. Few locules of free air within the lower abdomen/pelvis consistent with associated micro perforation. 2. No other acute intra-abdominal or pelvic process identified. Normal appendix.  3. 3 mm left lower lobe pulmonary nodule, indeterminate. No follow-up needed if patient is low-risk. Non-contrast chest CT can be considered in 12 months if patient is high-risk. This recommendation follows the consensus statement: Guidelines for Management of Incidental Pulmonary Nodules Detected on CT Images: From the Fleischner Society 2017; Radiology 2017; 284:228-243. Critical Value/emergent results were called by telephone at the time of interpretation on 12/17/2019 at 5:13 am to provider DAVID [facility] , who verbally acknowledged these results. Electronically Signed   By: Jeannine Boga M.D.   On: 12/17/2019 05:15    Review of Systems  Constitutional: Negative for chills and fever.  Respiratory: Negative for cough.   Gastrointestinal: Positive for abdominal pain and constipation.  Genitourinary: Negative for dysuria and hematuria.  All other systems reviewed and are negative.  Blood pressure 110/62, pulse 83, temperature 98.7 F (37.1 C), resp. rate 16, height 5\' 4"  (1.626 m), weight 64.9 kg, last menstrual period 12/15/2019, SpO2 98 %. Physical Exam Constitutional:      General: She is not in acute distress.    Appearance: She is well-developed and normal weight. She is not ill-appearing or diaphoretic.  HENT:     Head: Normocephalic and atraumatic.  Cardiovascular:  Rate and Rhythm: Normal rate and regular rhythm.     Heart sounds: Normal heart sounds. No murmur heard.   Pulmonary:     Effort: Pulmonary effort is normal. No respiratory distress.     Breath sounds: Normal breath sounds.  Abdominal:     General: Abdomen is flat. There is no distension.     Palpations: Abdomen is soft.     Tenderness: There is abdominal tenderness in the left lower quadrant.     Comments: There is moderate tenderness and guarding in the left lower quadrant.  There are no hernias  There is no hepatosplenomegaly  Musculoskeletal:        General: No tenderness or deformity. Normal range of  motion.     Right lower leg: No edema.     Left lower leg: No edema.  Skin:    General: Skin is warm and dry.     Coloration: Skin is not jaundiced or pale.  Neurological:     General: No focal deficit present.     Mental Status: She is alert and oriented to person, place, and time. Mental status is at baseline.  Psychiatric:        Behavior: Behavior normal.        Thought Content: Thought content normal.        Judgment: Judgment normal.     Assessment/Plan: Sigmoid diverticulitis with microperforation  I discussed the diagnosis with the patient and family in the room.  We discussed diverticulitis in detail.  She does have a contained microperforation.  Currently, the plan will be to continue and attempted conservative management with bowel rest and IV antibiotics.  I would not advance her past clear liquids for now.  If she does not improve in the next 24 to 48 hours, she may need a repeat CT scan of the abdomen and pelvis to see if an abscess is developing.  If she acutely worsens, she could need an exploratory laparotomy with resection and a colostomy.  We will follow her closely with you.  She agrees with the current plan.  Should she improve she would need to be referred to a gastroenterologist as an outpatient to consider a colonoscopy in 6 to 8 weeks to rule out a malignancy.  Coralie Keens 12/18/2019, 9:44 AM

## 2019-12-18 NOTE — Progress Notes (Signed)
PROGRESS NOTE    Tiffany Stevenson  TKW:[id number] DOB: February 19, 1976 DOA: 12/17/2019 PCP: Karma Ganja, NP   Brief Narrative:  Patient is a 44 year old female with history of diabetes type 2 who presented with abdominal pain, nausea for 2 days prior to admission.  Pain was mainly in the right side without radiation.  No report of fever or chills at home.  On presentation, CT scan showed sigmoid diverticulitis with microperforation.  Started on ertapenem.  General surgery consulted today.   Assessment & Plan:   Active Problems:   Acute diverticulitis   Pulmonary nodule less than 6 cm determined by computed tomography of lung    Acute sigmoid diverticulitis: Presented with left lower quadrant pain, nausea.  Mild grade fever this mrng.  CT abdomen showed sigmoid diverticulitis, with respiratory microperforation.  Started on ertapenem.  Continue IV fluids.  Currently on clear liquid diet.  Continue pain management. Given the finding of microperforation, I have requested for general surgery consultation.  Pulmonary nodule: CT also showed 3 mm left lower lobe pulmonary nodule, indeterminate. No follow-up needed because patient is no risk, non-smoker         DVT prophylaxis: Lovenox Code Status: Full Family Communication: Son present at the bedside Status is: Inpatient  Remains inpatient appropriate because:IV treatments appropriate due to intensity of illness or inability to take PO   Dispo: The patient is from: Home              Anticipated d/c is to: Home              Anticipated d/c date is: 2-3 days              Patient currently is not medically stable to d/c.     Consultants: General surgery  Procedures:None  Antimicrobials:  Anti-infectives (From admission, onward)   Start     Dose/Rate Route Frequency Ordered Stop   12/17/19 1800  ertapenem (INVANZ) 1,000 mg in sodium chloride 0.9 % 100 mL IVPB     Discontinue     1 g 200 mL/hr over 30 Minutes  Intravenous Every 24 hours 12/17/19 1041     12/17/19 1045  ertapenem [facility]) injection 1,000 mg  Status:  Discontinued        1 g Intramuscular Every 24 hours 12/17/19 1034 12/17/19 1041   12/17/19 0530  ciprofloxacin (CIPRO) IVPB 400 mg        400 mg 200 mL/hr over 60 Minutes Intravenous  Once 12/17/19 0519 12/17/19 0655   12/17/19 0530  metroNIDAZOLE (FLAGYL) IVPB 500 mg        500 mg 100 mL/hr over 60 Minutes Intravenous  Once 12/17/19 0519 12/17/19 0759      Subjective: Patient seen and examined at the bedside this morning.  Hemodynamically stable.  He had mild fever this morning.  She was still having some belly discomfort but no vomiting.  Objective: Vitals:   12/17/19 2144 12/18/19 0002 12/18/19 0453 12/18/19 0600  BP: (!) 94/59 96/62 (!) 107/58   Pulse: 79 85 86   Resp: 17 14 16    Temp:  98.7 F (37.1 C) (!) 100.6 F (38.1 C) 99 F (37.2 C)  TempSrc:  Oral Oral Oral  SpO2: 99% 98% 97%   Weight:      Height:        Intake/Output Summary (Last 24 hours) at 12/18/2019 0758 Last data filed at 12/18/2019 0600 Gross per 24 hour  Intake 2585.33 ml  Output 2150 ml  Net 435.33 ml   Filed Weights   12/17/19 0215 12/17/19 0850  Weight: 64.9 kg 64.9 kg    Examination:  General exam: Generalized weakness , not in apparent distress HEENT:PERRL,Oral mucosa moist, Ear/Nose normal on gross exam Respiratory system: Bilateral equal air entry, normal vesicular breath sounds, no wheezes or crackles  Cardiovascular system: S1 & S2 heard, RRR. No JVD, murmurs, rubs, gallops or clicks. Gastrointestinal system: Abdomen is nondistended, soft and has generalized tenderness . No organomegaly or masses felt. Normal bowel sounds heard. Central nervous system: Alert and oriented. No focal neurological deficits. Extremities: No edema, no clubbing ,no cyanosis, distal peripheral pulses palpable. Skin: No rashes, lesions or ulcers,no icterus ,no pallor   Data Reviewed: I have personally  reviewed following labs and imaging studies  CBC: Recent Labs  Lab 12/17/19 0219 12/18/19 0524  WBC 7.4 11.5*  HGB 12.7 10.1*  HCT 38.1 31.3*  MCV 89.0 94.0  PLT 257 413   Basic Metabolic Panel: Recent Labs  Lab 12/17/19 0219 12/18/19 0524  NA 136 135  K 3.4* 4.1  CL 104 109  CO2 23 20*  GLUCOSE 135* 156*  BUN 12 <5*  CREATININE 0.69 0.55  CALCIUM 10.6* 9.1   GFR: Estimated Creatinine Clearance: 78.3 mL/min (by C-G formula based on SCr of 0.55 mg/dL). Liver Function Tests: Recent Labs  Lab 12/17/19 0219  AST 11*  ALT 13  ALKPHOS 51  BILITOT 0.4  PROT 7.1  ALBUMIN 3.5   Recent Labs  Lab 12/17/19 0219  LIPASE 22   No results for input(s): AMMONIA in the last 168 hours. Coagulation Profile: No results for input(s): INR, PROTIME in the last 168 hours. Cardiac Enzymes: No results for input(s): CKTOTAL, CKMB, CKMBINDEX, TROPONINI in the last 168 hours. BNP (last 3 results) No results for input(s): PROBNP in the last 8760 hours. HbA1C: Recent Labs    12/17/19 1109  HGBA1C 5.8*   CBG: Recent Labs  Lab 12/17/19 0226 12/17/19 1147 12/17/19 1615 12/17/19 2018 12/18/19 0742  GLUCAP 122* 146* 202* 100* 140*   Lipid Profile: No results for input(s): CHOL, HDL, LDLCALC, TRIG, CHOLHDL, LDLDIRECT in the last 72 hours. Thyroid Function Tests: No results for input(s): TSH, T4TOTAL, FREET4, T3FREE, THYROIDAB in the last 72 hours. Anemia Panel: No results for input(s): VITAMINB12, FOLATE, FERRITIN, TIBC, IRON, RETICCTPCT in the last 72 hours. Sepsis Labs: No results for input(s): PROCALCITON, LATICACIDVEN in the last 168 hours.  Recent Results (from the past 240 hour(s))  SARS Coronavirus 2 by RT PCR (hospital order, performed in [facility]: [facility] hospital lab) Nasopharyngeal Nasopharyngeal Swab     Status: None   Collection Time: 12/17/19  5:47 AM   Specimen: Nasopharyngeal Swab  Result Value Ref Range Status   SARS Coronavirus 2 NEGATIVE NEGATIVE Final     Comment: (NOTE) SARS-CoV-2 target nucleic acids are NOT DETECTED.  The SARS-CoV-2 RNA is generally detectable in upper and lower respiratory specimens during the acute phase of infection. The lowest concentration of SARS-CoV-2 viral copies this assay can detect is 250 copies / mL. A negative result does not preclude SARS-CoV-2 infection and should not be used as the sole basis for treatment or other patient management decisions.  A negative result may occur with improper specimen collection / handling, submission of specimen other than nasopharyngeal swab, presence of viral mutation(s) within the areas targeted by this assay, and inadequate number of viral copies (<250 copies / mL). A negative result must be combined with clinical observations, patient  history, and epidemiological information.  Fact Sheet for Patients:   StrictlyIdeas.no  Fact Sheet for Healthcare Providers: BankingDealers.co.za  This test is not yet approved or  cleared by the [place] FDA and has been authorized for detection and/or diagnosis of SARS-CoV-2 by FDA under an Emergency Use Authorization (EUA).  This EUA will remain in effect (meaning this test can be used) for the duration of the COVID-19 declaration under Section 564(b)(1) of the Act, 21 U.S.C. section 360bbb-3(b)(1), unless the authorization is terminated or revoked sooner.  Performed at [facility], [address] [place], [place] [address]          Radiology Studies: CT ABDOMEN PELVIS W CONTRAST  Result Date: 12/17/2019 CLINICAL DATA:  Initial evaluation for acute right lower quadrant abdominal pain. EXAM: CT ABDOMEN AND PELVIS WITH CONTRAST TECHNIQUE: Multidetector CT imaging of the abdomen and pelvis was performed using the standard protocol following bolus administration of intravenous contrast. CONTRAST:  149mL OMNIPAQUE IOHEXOL 300 MG/ML  SOLN COMPARISON:  None available.  FINDINGS: Lower chest: 3 mm nodule partially visualized at the left lower lobe (series 3, image 1), indeterminate. Visualized lungs are otherwise clear. Hepatobiliary: Liver within normal limits. Focal fat deposition noted adjacent to the falciform ligament. Gallbladder normal. No biliary dilatation. Pancreas: Pancreas within normal limits. Spleen: Spleen within normal limits. Adrenals/Urinary Tract: Adrenal glands are normal. Kidneys equal size with symmetric enhancement. No nephrolithiasis, hydronephrosis, or focal enhancing renal mass. No visible hydroureter. Partially distended bladder within normal limits. Stomach/Bowel: Stomach within normal limits. No evidence for bowel obstruction. Appendix well visualized within the right lower quadrant and is within normal limits for caliber without evidence for acute appendicitis. Multiple scattered colonic diverticula seen, most pronounced at the sigmoid colon. Associated wall thickening with inflammatory stranding about the sigmoid colon consistent with acute diverticulitis. Associated inflammatory stranding throughout the pericolonic and omental fat noted within the lower abdomen. Small volume free fluid within the pelvis, likely reactive. Few locules of free air seen within the lower abdomen/pelvis along the midline (series 2, image 71, 65 - series 5, image 22 on coronal sequence), suggestive of a micro perforation. Site of perforation not definitely seen. Vascular/Lymphatic: Normal intravascular enhancement seen throughout the intra-abdominal aorta. Mesenteric vessels patent proximally. No pathologically enlarged intra-abdominopelvic lymph nodes. Reproductive: IUD in appropriate position within the uterus. Mild prominence of the parametrial vasculature noted. Ovaries within normal limits for age. Other: Small fat containing paraumbilical hernia noted without associated inflammation. Musculoskeletal: External soft tissues within normal limits. No acute osseous  abnormality. No discrete or worrisome osseous lesions. IMPRESSION: 1. Findings consistent with acute sigmoid diverticulitis. Few locules of free air within the lower abdomen/pelvis consistent with associated micro perforation. 2. No other acute intra-abdominal or pelvic process identified. Normal appendix. 3. 3 mm left lower lobe pulmonary nodule, indeterminate. No follow-up needed if patient is low-risk. Non-contrast chest CT can be considered in 12 months if patient is high-risk. This recommendation follows the consensus statement: Guidelines for Management of Incidental Pulmonary Nodules Detected on CT Images: From the Fleischner Society 2017; Radiology 2017; 284:228-243. Critical Value/emergent results were called by telephone at the time of interpretation on 12/17/2019 at 5:13 am to provider DAVID [facility] , who verbally acknowledged these results. Electronically Signed   By: Jeannine Boga M.D.   On: 12/17/2019 05:15        Scheduled Meds: . insulin aspart  0-9 Units Subcutaneous TID WC  . insulin aspart  3 Units Subcutaneous TID WC  .  insulin detemir  5 Units Subcutaneous QHS  . pneumococcal 23 valent vaccine  0.5 mL Intramuscular Tomorrow-1000   Continuous Infusions: . dexrose 5 % and 0.45 % NaCl with KCl 30 mEq/L 100 mL/hr (12/17/19 1233)  . ertapenem 1,000 mg (12/17/19 1816)     LOS: 1 day    Time spent:35 mins. More than 50% of that time was spent in counseling and/or coordination of care.      Shelly Coss, MD [facility] P8/15/2021, 7:58 AM

## 2019-12-19 LAB — BASIC METABOLIC PANEL
Anion gap: 7 (ref 5–15)
BUN: <5 mg/dL — ABNORMAL LOW (ref 6–20)
CO2: 22 mmol/L (ref 22–32)
Calcium: 9.7 mg/dL (ref 8.9–10.3)
Chloride: 109 mmol/L (ref 98–111)
Creatinine, Ser: 0.44 mg/dL (ref 0.44–1.00)
GFR calc Af Amer: >60 mL/min (ref >60)
GFR calc non Af Amer: >60 mL/min (ref >60)
Glucose, Bld: 112 mg/dL — ABNORMAL HIGH (ref 70–99)
Potassium: 4.6 mmol/L (ref 3.5–5.1)
Sodium: 138 mmol/L (ref 135–145)

## 2019-12-19 LAB — CBC WITH DIFFERENTIAL/PLATELET
Abs Immature Granulocytes: 0.06 10*3/uL (ref 0.00–0.07)
Basophils Absolute: 0 10*3/uL (ref 0.0–0.1)
Basophils Relative: 0 %
Eosinophils Absolute: 0.2 10*3/uL (ref 0.0–0.5)
Eosinophils Relative: 1 %
HCT: 31.6 % — ABNORMAL LOW (ref 36.0–46.0)
Hemoglobin: 10.1 g/dL — ABNORMAL LOW (ref 12.0–15.0)
Immature Granulocytes: 1 %
Lymphocytes Relative: 11 %
Lymphs Abs: 1.3 10*3/uL (ref 0.7–4.0)
MCH: 29.9 pg (ref 26.0–34.0)
MCHC: 32 g/dL (ref 30.0–36.0)
MCV: 93.5 fL (ref 80.0–100.0)
Monocytes Absolute: 0.7 10*3/uL (ref 0.1–1.0)
Monocytes Relative: 6 %
Neutro Abs: 9.4 10*3/uL — ABNORMAL HIGH (ref 1.7–7.7)
Neutrophils Relative %: 81 %
Platelets: 184 10*3/uL (ref 150–400)
RBC: 3.38 MIL/uL — ABNORMAL LOW (ref 3.87–5.11)
RDW: 12.3 % (ref 11.5–15.5)
WBC: 11.7 10*3/uL — ABNORMAL HIGH (ref 4.0–10.5)
nRBC: 0 % (ref 0.0–0.2)

## 2019-12-19 LAB — GLUCOSE, CAPILLARY
Glucose-Capillary: 100 mg/dL — ABNORMAL HIGH (ref 70–99)
Glucose-Capillary: 85 mg/dL (ref 70–99)
Glucose-Capillary: 81 mg/dL (ref 70–99)
Glucose-Capillary: 64 mg/dL — ABNORMAL LOW (ref 70–99)

## 2019-12-19 MED ORDER — PIPERACILLIN-TAZOBACTAM 3.375 G IVPB
3.3750 g | Freq: Three times a day (TID) | INTRAVENOUS | Status: DC
  Administered 2019-12-19 – 2019-12-23 (×12): 3.375 g via INTRAVENOUS
  Filled 2019-12-19 (×12): qty 50

## 2019-12-19 MED ORDER — METRONIDAZOLE IN NACL 5-0.79 MG/ML-% IV SOLN
500.0000 mg | Freq: Three times a day (TID) | INTRAVENOUS | Status: DC
  Administered 2019-12-19: 500 mg via INTRAVENOUS
  Filled 2019-12-19: qty 100

## 2019-12-19 MED ORDER — SODIUM CHLORIDE 0.9 % IV SOLN
2.0000 g | INTRAVENOUS | Status: DC
  Administered 2019-12-19: 2 g via INTRAVENOUS
  Filled 2019-12-19: qty 2

## 2019-12-19 NOTE — Progress Notes (Signed)
PROGRESS NOTE    Tiffany Stevenson  KGM:[id number] DOB: November 16, 1975 DOA: 12/17/2019 PCP: Karma Ganja, NP   Brief Narrative:  Patient is a 44 year old female with history of diabetes type 2 who presented with abdominal pain, nausea for 2 days prior to admission.  Pain was mainly in the right side without radiation.  No report of fever or chills at home.  On presentation, CT scan showed sigmoid diverticulitis with microperforation.  Started on ertapenem.  General surgery consulted and following   Assessment & Plan:   Active Problems:   Acute diverticulitis   Pulmonary nodule less than 6 cm determined by computed tomography of lung    Acute sigmoid diverticulitis: Presented with left lower quadrant pain, nausea. Afebrile this mrng.  CT abdomen showed sigmoid diverticulitis, with  microperforation.  Started on ertapenem.  Continue IV fluids.  Currently only on sips.  Given the finding of microperforation, general surgery also following Plan is to continue conservative management.  Pulmonary nodule: CT also showed 3 mm left lower lobe pulmonary nodule, indeterminate. No follow-up needed because patient has low  risk, non-smoker         DVT prophylaxis: Lovenox Code Status: Full Family Communication: None present at the bedside Status is: Inpatient  Remains inpatient appropriate because:IV treatments appropriate due to intensity of illness or inability to take PO   Dispo: The patient is from: Home              Anticipated d/c is to: Home              Anticipated d/c date is: 2-3 days              Patient currently is not medically stable to d/c.     Consultants: General surgery  Procedures:None  Antimicrobials:  Anti-infectives (From admission, onward)   Start     Dose/Rate Route Frequency Ordered Stop   12/17/19 1800  ertapenem (INVANZ) 1,000 mg in sodium chloride 0.9 % 100 mL IVPB     Discontinue     1 g 200 mL/hr over 30 Minutes Intravenous Every 24 hours  12/17/19 1041     12/17/19 1045  ertapenem [facility]) injection 1,000 mg  Status:  Discontinued        1 g Intramuscular Every 24 hours 12/17/19 1034 12/17/19 1041   12/17/19 0530  ciprofloxacin (CIPRO) IVPB 400 mg        400 mg 200 mL/hr over 60 Minutes Intravenous  Once 12/17/19 0519 12/17/19 0655   12/17/19 0530  metroNIDAZOLE (FLAGYL) IVPB 500 mg        500 mg 100 mL/hr over 60 Minutes Intravenous  Once 12/17/19 0519 12/17/19 0759      Subjective: Patient seen and examined the bedside this morning.  Hemodynamically stable.  She feels better but she still has some abdominal pain.  Passing gas, no bowel movement.  Afebrile today.   Objective: Vitals:   12/18/19 1758 12/18/19 2145 12/19/19 0612 12/19/19 0627  BP: 121/66 103/68 120/70 120/70  Pulse: 89 76 79 79  Resp: 15 16 18 18   Temp: (!) 101.2 F (38.4 C) 98.5 F (36.9 C) 98.2 F (36.8 C) 98.2 F (36.8 C)  TempSrc:  Oral Oral Oral  SpO2: 96% 98% 99% 99%  Weight:      Height:        Intake/Output Summary (Last 24 hours) at 12/19/2019 0742 Last data filed at 12/19/2019 0600 Gross per 24 hour  Intake 2128.75 ml  Output 2200  ml  Net -71.25 ml   Filed Weights   12/17/19 0215 12/17/19 0850  Weight: 64.9 kg 64.9 kg    Examination:   General exam: Awake, not in apparent distress  HEENT:PERRL,Oral mucosa moist, Ear/Nose normal on gross exam Respiratory system: Bilateral equal air entry, normal vesicular breath sounds, no wheezes or crackles  Cardiovascular system: S1 & S2 heard, RRR. No JVD, murmurs, rubs, gallops or clicks. Gastrointestinal system: Abdomen is nondistended, soft and is tender mainly on the left side bowel sounds heard  Central nervous system: Alert and oriented. No focal neurological deficits. Extremities: No edema, no clubbing ,no cyanosis, distal peripheral pulses palpable. Skin: No rashes, lesions or ulcers,no icterus ,no pallor    Data Reviewed: I have personally reviewed following labs and  imaging studies  CBC: Recent Labs  Lab 12/17/19 0219 12/18/19 0524 12/19/19 0454  WBC 7.4 11.5* 11.7*  NEUTROABS  --   --  9.4*  HGB 12.7 10.1* 10.1*  HCT 38.1 31.3* 31.6*  MCV 89.0 94.0 93.5  PLT 257 188 268   Basic Metabolic Panel: Recent Labs  Lab 12/17/19 0219 12/18/19 0524 12/19/19 0454  NA 136 135 138  K 3.4* 4.1 4.6  CL 104 109 109  CO2 23 20* 22  GLUCOSE 135* 156* 112*  BUN 12 <5* <5*  CREATININE 0.69 0.55 0.44  CALCIUM 10.6* 9.1 9.7   GFR: Estimated Creatinine Clearance: 78.3 mL/min (by C-G formula based on SCr of 0.44 mg/dL). Liver Function Tests: Recent Labs  Lab 12/17/19 0219  AST 11*  ALT 13  ALKPHOS 51  BILITOT 0.4  PROT 7.1  ALBUMIN 3.5   Recent Labs  Lab 12/17/19 0219  LIPASE 22   No results for input(s): AMMONIA in the last 168 hours. Coagulation Profile: No results for input(s): INR, PROTIME in the last 168 hours. Cardiac Enzymes: No results for input(s): CKTOTAL, CKMB, CKMBINDEX, TROPONINI in the last 168 hours. BNP (last 3 results) No results for input(s): PROBNP in the last 8760 hours. HbA1C: Recent Labs    12/17/19 1109  HGBA1C 5.8*   CBG: Recent Labs  Lab 12/18/19 0742 12/18/19 1145 12/18/19 1647 12/18/19 2140 12/19/19 0736  GLUCAP 140* 162* 190* 144* 100*   Lipid Profile: No results for input(s): CHOL, HDL, LDLCALC, TRIG, CHOLHDL, LDLDIRECT in the last 72 hours. Thyroid Function Tests: No results for input(s): TSH, T4TOTAL, FREET4, T3FREE, THYROIDAB in the last 72 hours. Anemia Panel: No results for input(s): VITAMINB12, FOLATE, FERRITIN, TIBC, IRON, RETICCTPCT in the last 72 hours. Sepsis Labs: No results for input(s): PROCALCITON, LATICACIDVEN in the last 168 hours.  Recent Results (from the past 240 hour(s))  SARS Coronavirus 2 by RT PCR (hospital order, performed in [facility] hospital lab) Nasopharyngeal Nasopharyngeal Swab     Status: None   Collection Time: 12/17/19  5:47 AM   Specimen: Nasopharyngeal  Swab  Result Value Ref Range Status   SARS Coronavirus 2 NEGATIVE NEGATIVE Final    Comment: (NOTE) SARS-CoV-2 target nucleic acids are NOT DETECTED.  The SARS-CoV-2 RNA is generally detectable in upper and lower respiratory specimens during the acute phase of infection. The lowest concentration of SARS-CoV-2 viral copies this assay can detect is 250 copies / mL. A negative result does not preclude SARS-CoV-2 infection and should not be used as the sole basis for treatment or other patient management decisions.  A negative result may occur with improper specimen collection / handling, submission of specimen other than nasopharyngeal swab, presence of viral mutation(s)  within the areas targeted by this assay, and inadequate number of viral copies (<250 copies / mL). A negative result must be combined with clinical observations, patient history, and epidemiological information.  Fact Sheet for Patients:   StrictlyIdeas.no  Fact Sheet for Healthcare Providers: BankingDealers.co.za  This test is not yet approved or  cleared by the [place] FDA and has been authorized for detection and/or diagnosis of SARS-CoV-2 by FDA under an Emergency Use Authorization (EUA).  This EUA will remain in effect (meaning this test can be used) for the duration of the COVID-19 declaration under Section 564(b)(1) of the Act, 21 U.S.C. section 360bbb-3(b)(1), unless the authorization is terminated or revoked sooner.  Performed at [facility], [address] [place], [place] [address]   Culture, blood (routine x 2)     Status: None (Preliminary result)   Collection Time: 12/18/19  8:51 AM   Specimen: BLOOD  Result Value Ref Range Status   Specimen Description   Final    BLOOD RIGHT ARM Performed at [place] [address], [place], [place] [address]    Special Requests   Final    BOTTLES DRAWN AEROBIC AND  ANAEROBIC Blood Culture adequate volume Performed at [facility] [address], [place], [place] [address]    Culture   Final    NO GROWTH < 24 HOURS Performed at [place] [address] [place], [place] [address]    Report Status PENDING  Incomplete  Culture, blood (routine x 2)     Status: None (Preliminary result)   Collection Time: 12/18/19  8:55 AM   Specimen: BLOOD  Result Value Ref Range Status   Specimen Description   Final    BLOOD RIGHT WRIST Performed at [place] [address], [place], [place] [address]    Special Requests   Final    BOTTLES DRAWN AEROBIC AND ANAEROBIC Blood Culture adequate volume Performed at [facility] [address], [place], [place] [address]    Culture   Final    NO GROWTH < 24 HOURS Performed at [facility] [address] [place], [place] [address]    Report Status PENDING  Incomplete         Radiology Studies: No results found.      Scheduled Meds: . enoxaparin (LOVENOX) injection  40 mg Subcutaneous Daily  . insulin aspart  0-9 Units Subcutaneous TID WC  . insulin aspart  3 Units Subcutaneous TID WC  . insulin detemir  5 Units Subcutaneous QHS  . pneumococcal 23 valent vaccine  0.5 mL Intramuscular Tomorrow-1000   Continuous Infusions: . sodium chloride 75 mL/hr at 12/19/19 0732  . ertapenem 1,000 mg (12/18/19 1722)     LOS: 2 days    Time spent:35 mins. More than 50% of that time was spent in counseling and/or coordination of care.      Shelly Coss, MD [facility] P8/16/2021, 7:42 AM

## 2019-12-19 NOTE — Progress Notes (Signed)
Subjective: CC: Doing okay. Reports her pain has improved from a 9/10 prior to admission to a 5-6/10 with pain medication. Pain is constant and located in the periumbilical, suprapubic and LLQ. She is passing flatus. No BM. No n/v. Tolerating sips of clears without increased pain. No prior hx of diverticulitis per patient. She has never had a colonoscopy.   Objective: Vital signs in last 24 hours: Temp:  [98.2 F (36.8 C)-101.2 F (38.4 C)] 98.2 F (36.8 C) (08/16 0627) Pulse Rate:  [76-89] 79 (08/16 0627) Resp:  [15-18] 18 (08/16 0627) BP: (102-121)/(56-70) 120/70 (08/16 0627) SpO2:  [95 %-99 %] 99 % (08/16 0627) Last BM Date: 12/10/19  Intake/Output from previous day: 08/15 0701 - 08/16 0700 In: 2128.8 [P.O.:610; I.V.:1318.8; IV Piggyback:200] Out: 2200 [Urine:2200] Intake/Output this shift: No intake/output data recorded.  PE: Gen:  Alert, NAD, pleasant Card:  reg Pulm:  Normal rate and effort  Abd: Soft, ND, generalized tenderness greatest periumbilical and LLQ without peritonitis, +BS Ext:  No LE edema  Psych: A&Ox3  Skin: no rashes noted, warm and dry   Lab Results:  Recent Labs    12/18/19 0524 12/19/19 0454  WBC 11.5* 11.7*  HGB 10.1* 10.1*  HCT 31.3* 31.6*  PLT 188 184   BMET Recent Labs    12/18/19 0524 12/19/19 0454  NA 135 138  K 4.1 4.6  CL 109 109  CO2 20* 22  GLUCOSE 156* 112*  BUN <5* <5*  CREATININE 0.55 0.44  CALCIUM 9.1 9.7   PT/INR No results for input(s): LABPROT, INR in the last 72 hours. CMP     Component Value Date/Time   NA 138 12/19/2019 0454   K 4.6 12/19/2019 0454   CL 109 12/19/2019 0454   CO2 22 12/19/2019 0454   GLUCOSE 112 (H) 12/19/2019 0454   BUN <5 (L) 12/19/2019 0454   CREATININE 0.44 12/19/2019 0454   CALCIUM 9.7 12/19/2019 0454   PROT 7.1 12/17/2019 0219   ALBUMIN 3.5 12/17/2019 0219   AST 11 (L) 12/17/2019 0219   ALT 13 12/17/2019 0219   ALKPHOS 51 12/17/2019 0219   BILITOT 0.4 12/17/2019 0219     GFRNONAA >60 12/19/2019 0454   GFRAA >60 12/19/2019 0454   Lipase     Component Value Date/Time   LIPASE 22 12/17/2019 0219       Studies/Results: No results found.  Anti-infectives: Anti-infectives (From admission, onward)   Start     Dose/Rate Route Frequency Ordered Stop   12/17/19 1800  ertapenem (INVANZ) 1,000 mg in sodium chloride 0.9 % 100 mL IVPB     Discontinue     1 g 200 mL/hr over 30 Minutes Intravenous Every 24 hours 12/17/19 1041     12/17/19 1045  ertapenem [facility]) injection 1,000 mg  Status:  Discontinued        1 g Intramuscular Every 24 hours 12/17/19 1034 12/17/19 1041   12/17/19 0530  ciprofloxacin (CIPRO) IVPB 400 mg        400 mg 200 mL/hr over 60 Minutes Intravenous  Once 12/17/19 0519 12/17/19 0655   12/17/19 0530  metroNIDAZOLE (FLAGYL) IVPB 500 mg        500 mg 100 mL/hr over 60 Minutes Intravenous  Once 12/17/19 0519 12/17/19 0759       Assessment/Plan DM2  Pulm nodule - will need follow up as outpatient   Diverticulitis w/ Microperforation  - This is the patients first episode of diverticulitis. No  prior colonoscopy - Cont sips of clears. Would not advance past this  - Cont abx - Hopefully patient will improve and be able to avoid surgery. We discussed if she was to worsen/fail to improve she may need a repeat CT scan in later this week to see if she is developing an intra-abdominal abscess. We also discussed the possibility if she was to worsen, she may require surgery that would likely result in a colostomy. If patient is able to get over this episode without surgery, she will need a colonoscopy in 4-6 weeks.   FEN - Sips of clears, IVF VTE - Scds, lovenox ID - Cirpo/Flagyl x 1. Invanz 8/14 >> WBC 11.7 from 11.5. Febrile overnight.   Plan:    LOS: 2 days    Jillyn Ledger , [facility] 12/19/2019, 9:00 AM Please see Amion for pager number during day hours 7:00am-4:30pm

## 2019-12-20 LAB — CBC
HCT: 29.1 % — ABNORMAL LOW (ref 36.0–46.0)
Hemoglobin: 9.7 g/dL — ABNORMAL LOW (ref 12.0–15.0)
MCH: 30.7 pg (ref 26.0–34.0)
MCHC: 33.3 g/dL (ref 30.0–36.0)
MCV: 92.1 fL (ref 80.0–100.0)
Platelets: 228 10*3/uL (ref 150–400)
RBC: 3.16 MIL/uL — ABNORMAL LOW (ref 3.87–5.11)
RDW: 12.1 % (ref 11.5–15.5)
WBC: 8.8 10*3/uL (ref 4.0–10.5)
nRBC: 0 % (ref 0.0–0.2)

## 2019-12-20 LAB — BASIC METABOLIC PANEL
Anion gap: 10 (ref 5–15)
BUN: <5 mg/dL — ABNORMAL LOW (ref 6–20)
CO2: 21 mmol/L — ABNORMAL LOW (ref 22–32)
Calcium: 9.4 mg/dL (ref 8.9–10.3)
Chloride: 107 mmol/L (ref 98–111)
Creatinine, Ser: 0.53 mg/dL (ref 0.44–1.00)
GFR calc Af Amer: >60 mL/min (ref >60)
GFR calc non Af Amer: >60 mL/min (ref >60)
Glucose, Bld: 95 mg/dL (ref 70–99)
Potassium: 3.7 mmol/L (ref 3.5–5.1)
Sodium: 138 mmol/L (ref 135–145)

## 2019-12-20 LAB — GLUCOSE, CAPILLARY
Glucose-Capillary: 116 mg/dL — ABNORMAL HIGH (ref 70–99)
Glucose-Capillary: 62 mg/dL — ABNORMAL LOW (ref 70–99)
Glucose-Capillary: 65 mg/dL — ABNORMAL LOW (ref 70–99)
Glucose-Capillary: 67 mg/dL — ABNORMAL LOW (ref 70–99)
Glucose-Capillary: 79 mg/dL (ref 70–99)
Glucose-Capillary: 84 mg/dL (ref 70–99)
Glucose-Capillary: 84 mg/dL (ref 70–99)
Glucose-Capillary: 99 mg/dL (ref 70–99)

## 2019-12-20 MED ORDER — BUTALBITAL-APAP-CAFFEINE 50-325-40 MG PO TABS
1.0000 | ORAL_TABLET | ORAL | Status: DC | PRN
  Administered 2019-12-20: 1 via ORAL
  Filled 2019-12-20: qty 1

## 2019-12-20 NOTE — Progress Notes (Signed)
PROGRESS NOTE    Tiffany Stevenson  XAJ:[id number] DOB: [birth year] DOA: 12/17/2019 PCP: Karma Ganja, NP   Brief Narrative:  Patient is a 44 year old female with history of diabetes type 2 who presented with abdominal pain, nausea for 2 days prior to admission.  Pain was mainly in the right side without radiation.  No report of fever or chills at home.  On presentation, CT scan showed sigmoid diverticulitis with microperforation.  Started on zosyn.  General surgery consulted and following.  Currently on conservative management.   Assessment & Plan:   Active Problems:   Acute diverticulitis   Pulmonary nodule less than 6 cm determined by computed tomography of lung    Acute sigmoid diverticulitis: Presented with left lower quadrant pain, nausea. Afebrile this mrng.  CT abdomen showed sigmoid diverticulitis, with  microperforation.  Started on zosyn.  Continue IV fluids.  Currently only on sips.  Given the finding of microperforation, general surgery also following Currently on conservative management but if her overall condition does not improve, general surgery planning for colectomy.  Pulmonary nodule: CT also showed 3 mm left lower lobe pulmonary nodule, indeterminate. No follow-up needed because patient has low  risk, non-smoker         DVT prophylaxis: Lovenox Code Status: Full Family Communication: None present at the bedside Status is: Inpatient  Remains inpatient appropriate because:IV treatments appropriate due to intensity of illness or inability to take PO   Dispo: The patient is from: Home              Anticipated d/c is to: Home              Anticipated d/c date is: 2-3 days              Patient currently is not medically stable to d/c.     Consultants: General surgery  Procedures:None  Antimicrobials:  Anti-infectives (From admission, onward)   Start     Dose/Rate Route Frequency Ordered Stop   12/19/19 1600  piperacillin-tazobactam (ZOSYN)  IVPB 3.375 g     Discontinue     3.375 g 12.5 mL/hr over 240 Minutes Intravenous Every 8 hours 12/19/19 1545     12/19/19 1500  cefTRIAXone (ROCEPHIN) 2 g in sodium chloride 0.9 % 100 mL IVPB  Status:  Discontinued        2 g 200 mL/hr over 30 Minutes Intravenous Every 24 hours 12/19/19 1341 12/19/19 1545   12/19/19 1500  metroNIDAZOLE (FLAGYL) IVPB 500 mg  Status:  Discontinued        500 mg 100 mL/hr over 60 Minutes Intravenous Every 8 hours 12/19/19 1341 12/19/19 1545   12/17/19 1800  ertapenem (INVANZ) 1,000 mg in sodium chloride 0.9 % 100 mL IVPB  Status:  Discontinued        1 g 200 mL/hr over 30 Minutes Intravenous Every 24 hours 12/17/19 1041 12/19/19 1341   12/17/19 1045  ertapenem (INVANZ) injection 1,000 mg  Status:  Discontinued        1 g Intramuscular Every 24 hours 12/17/19 1034 12/17/19 1041   12/17/19 0530  ciprofloxacin (CIPRO) IVPB 400 mg        400 mg 200 mL/hr over 60 Minutes Intravenous  Once 12/17/19 0519 12/17/19 0655   12/17/19 0530  metroNIDAZOLE (FLAGYL) IVPB 500 mg        500 mg 100 mL/hr over 60 Minutes Intravenous  Once 12/17/19 0519 12/17/19 0759      Subjective:  Patient seen  and examined the bedside this morning. Hemodynamically stable. Had mild grade fever last night. Afebrile this morning. Complains of headache. Abdominal pain is still there.  Objective: Vitals:   12/19/19 0627 12/19/19 1303 12/19/19 2234 12/20/19 0551  BP: 120/70 110/67 117/69 115/68  Pulse: 79 79 82 75  Resp: 18  16 18   Temp: 98.2 F (36.8 C) 98.5 F (36.9 C) 100.2 F (37.9 C) 97.9 F (36.6 C)  TempSrc: Oral Oral Oral Oral  SpO2: 99% 99% 98% 100%  Weight:      Height:        Intake/Output Summary (Last 24 hours) at 12/20/2019 0751 Last data filed at 12/20/2019 0600 Gross per 24 hour  Intake 2099.52 ml  Output 1202 ml  Net 897.52 ml   Filed Weights   12/17/19 0215 12/17/19 0850  Weight: 64.9 kg 64.9 kg    Examination:  General exam: Not in  distress Respiratory system: Bilateral equal air entry, normal vesicular breath sounds, no wheezes or crackles  Cardiovascular system: S1 & S2 heard, RRR. No JVD, murmurs, rubs, gallops or clicks. Gastrointestinal system: Abdomen is nondistended, soft and has generalized tenderness  No organomegaly or masses felt. Normal bowel sounds heard. Central nervous system: Alert and oriented. No focal neurological deficits. Extremities: No edema, no clubbing ,no cyanosis, distal peripheral pulses palpable. Skin: No rashes, lesions or ulcers,no icterus ,no pallor    Data Reviewed: I have personally reviewed following labs and imaging studies  CBC: Recent Labs  Lab 12/17/19 0219 12/18/19 0524 12/19/19 0454  WBC 7.4 11.5* 11.7*  NEUTROABS  --   --  9.4*  HGB 12.7 10.1* 10.1*  HCT 38.1 31.3* 31.6*  MCV 89.0 94.0 93.5  PLT 257 188 790   Basic Metabolic Panel: Recent Labs  Lab 12/17/19 0219 12/18/19 0524 12/19/19 0454  NA 136 135 138  K 3.4* 4.1 4.6  CL 104 109 109  CO2 23 20* 22  GLUCOSE 135* 156* 112*  BUN 12 <5* <5*  CREATININE 0.69 0.55 0.44  CALCIUM 10.6* 9.1 9.7   GFR: Estimated Creatinine Clearance: 78.3 mL/min (by C-G formula based on SCr of 0.44 mg/dL). Liver Function Tests: Recent Labs  Lab 12/17/19 0219  AST 11*  ALT 13  ALKPHOS 51  BILITOT 0.4  PROT 7.1  ALBUMIN 3.5   Recent Labs  Lab 12/17/19 0219  LIPASE 22   No results for input(s): AMMONIA in the last 168 hours. Coagulation Profile: No results for input(s): INR, PROTIME in the last 168 hours. Cardiac Enzymes: No results for input(s): CKTOTAL, CKMB, CKMBINDEX, TROPONINI in the last 168 hours. BNP (last 3 results) No results for input(s): PROBNP in the last 8760 hours. HbA1C: Recent Labs    12/17/19 1109  HGBA1C 5.8*   CBG: Recent Labs  Lab 12/19/19 0736 12/19/19 1120 12/19/19 1627 12/19/19 2135 12/20/19 0019  GLUCAP 100* 85 81 64* 99   Lipid Profile: No results for input(s): CHOL,  HDL, LDLCALC, TRIG, CHOLHDL, LDLDIRECT in the last 72 hours. Thyroid Function Tests: No results for input(s): TSH, T4TOTAL, FREET4, T3FREE, THYROIDAB in the last 72 hours. Anemia Panel: No results for input(s): VITAMINB12, FOLATE, FERRITIN, TIBC, IRON, RETICCTPCT in the last 72 hours. Sepsis Labs: No results for input(s): PROCALCITON, LATICACIDVEN in the last 168 hours.  Recent Results (from the past 240 hour(s))  SARS Coronavirus 2 by RT PCR (hospital order, performed in [facility] hospital lab) Nasopharyngeal Nasopharyngeal Swab     Status: None   Collection Time: 12/17/19  5:47 AM   Specimen: Nasopharyngeal Swab  Result Value Ref Range Status   SARS Coronavirus 2 NEGATIVE NEGATIVE Final    Comment: (NOTE) SARS-CoV-2 target nucleic acids are NOT DETECTED.  The SARS-CoV-2 RNA is generally detectable in upper and lower respiratory specimens during the acute phase of infection. The lowest concentration of SARS-CoV-2 viral copies this assay can detect is 250 copies / mL. A negative result does not preclude SARS-CoV-2 infection and should not be used as the sole basis for treatment or other patient management decisions.  A negative result may occur with improper specimen collection / handling, submission of specimen other than nasopharyngeal swab, presence of viral mutation(s) within the areas targeted by this assay, and inadequate number of viral copies (<250 copies / mL). A negative result must be combined with clinical observations, patient history, and epidemiological information.  Fact Sheet for Patients:   StrictlyIdeas.no  Fact Sheet for Healthcare Providers: BankingDealers.co.za  This test is not yet approved or  cleared by the [place] FDA and has been authorized for detection and/or diagnosis of SARS-CoV-2 by FDA under an Emergency Use Authorization (EUA).  This EUA will remain in effect (meaning this test can be used)  for the duration of the COVID-19 declaration under Section 564(b)(1) of the Act, 21 U.S.C. section 360bbb-3(b)(1), unless the authorization is terminated or revoked sooner.  Performed at [facility], [address] [place], [place] [address]   Culture, blood (routine x 2)     Status: None (Preliminary result)   Collection Time: 12/18/19  8:51 AM   Specimen: BLOOD  Result Value Ref Range Status   Specimen Description   Final    BLOOD RIGHT ARM Performed at [facility] [address], [place], [place] [address]    Special Requests   Final    BOTTLES DRAWN AEROBIC AND ANAEROBIC Blood Culture adequate volume Performed at [facility] [address], [place], [place] [address]    Culture   Final    NO GROWTH 2 DAYS Performed at [facility] [address] [place], [place] [address]    Report Status PENDING  Incomplete  Culture, blood (routine x 2)     Status: None (Preliminary result)   Collection Time: 12/18/19  8:55 AM   Specimen: BLOOD  Result Value Ref Range Status   Specimen Description   Final    BLOOD RIGHT WRIST Performed at [facility] [address], [place], [place] [address]    Special Requests   Final    BOTTLES DRAWN AEROBIC AND ANAEROBIC Blood Culture adequate volume Performed at [facility] [address] [place], [place] [address]    Culture   Final    NO GROWTH 2 DAYS Performed at [facility] [address], [place], [place] [address]    Report Status PENDING  Incomplete         Radiology Studies: No results found.      Scheduled Meds: . enoxaparin (LOVENOX) injection  40 mg Subcutaneous Daily  . insulin aspart  0-9 Units Subcutaneous TID WC  . insulin aspart  3 Units Subcutaneous TID WC  . insulin detemir  5 Units Subcutaneous QHS  . pneumococcal 23 valent vaccine  0.5 mL Intramuscular Tomorrow-1000   Continuous Infusions: .  sodium chloride 75 mL/hr at 12/20/19 0023  . piperacillin-tazobactam (ZOSYN)  IV 3.375 g (12/20/19 0518)     LOS: 3 days  Time spent:35 mins. More than 50% of that time was spent in counseling and/or coordination of care.      Shelly Coss, MD [facility] P8/17/2021, 7:51 AM

## 2019-12-20 NOTE — Progress Notes (Signed)
Subjective: CC: Patient reports that her pain is slightly better than yesterday. Still constant pain in the lower abdomen, worse on the left and around her umbilicus. Pain is worse with movement. She rates this as a 3-4/10. No n/v. Tolerating sips of clears. Passing flatus. No BM.   Objective: Vital signs in last 24 hours: Temp:  [97.9 F (36.6 C)-100.2 F (37.9 C)] 97.9 F (36.6 C) (08/17 0551) Pulse Rate:  [75-82] 75 (08/17 0551) Resp:  [16-18] 18 (08/17 0551) BP: (110-117)/(67-69) 115/68 (08/17 0551) SpO2:  [98 %-100 %] 100 % (08/17 0551) Last BM Date: 12/10/19  Intake/Output from previous day: 08/16 0701 - 08/17 0700 In: 2099.5 [P.O.:40; I.V.:1748.1; IV Piggyback:311.4] Out: 1202 [Urine:1202] Intake/Output this shift: No intake/output data recorded.  PE: Gen:  Alert, NAD, pleasant Card:  RRR Pulm:  Normal rate and effort  Abd: Soft, ND, tenderness of the LLQ>suprapubic/periumbilical>RLQ without peritonitis, +BS Ext:  No LE edema  Psych: A&Ox3  Skin: no rashes noted, warm and dry   Lab Results:  Recent Labs    12/18/19 0524 12/19/19 0454  WBC 11.5* 11.7*  HGB 10.1* 10.1*  HCT 31.3* 31.6*  PLT 188 184   BMET Recent Labs    12/18/19 0524 12/19/19 0454  NA 135 138  K 4.1 4.6  CL 109 109  CO2 20* 22  GLUCOSE 156* 112*  BUN <5* <5*  CREATININE 0.55 0.44  CALCIUM 9.1 9.7   PT/INR No results for input(s): LABPROT, INR in the last 72 hours. CMP     Component Value Date/Time   NA 138 12/19/2019 0454   K 4.6 12/19/2019 0454   CL 109 12/19/2019 0454   CO2 22 12/19/2019 0454   GLUCOSE 112 (H) 12/19/2019 0454   BUN <5 (L) 12/19/2019 0454   CREATININE 0.44 12/19/2019 0454   CALCIUM 9.7 12/19/2019 0454   PROT 7.1 12/17/2019 0219   ALBUMIN 3.5 12/17/2019 0219   AST 11 (L) 12/17/2019 0219   ALT 13 12/17/2019 0219   ALKPHOS 51 12/17/2019 0219   BILITOT 0.4 12/17/2019 0219   GFRNONAA >60 12/19/2019 0454   GFRAA >60 12/19/2019 0454   Lipase       Component Value Date/Time   LIPASE 22 12/17/2019 0219       Studies/Results: No results found.  Anti-infectives: Anti-infectives (From admission, onward)   Start     Dose/Rate Route Frequency Ordered Stop   12/19/19 1600  piperacillin-tazobactam (ZOSYN) IVPB 3.375 g     Discontinue     3.375 g 12.5 mL/hr over 240 Minutes Intravenous Every 8 hours 12/19/19 1545     12/19/19 1500  cefTRIAXone (ROCEPHIN) 2 g in sodium chloride 0.9 % 100 mL IVPB  Status:  Discontinued        2 g 200 mL/hr over 30 Minutes Intravenous Every 24 hours 12/19/19 1341 12/19/19 1545   12/19/19 1500  metroNIDAZOLE (FLAGYL) IVPB 500 mg  Status:  Discontinued        500 mg 100 mL/hr over 60 Minutes Intravenous Every 8 hours 12/19/19 1341 12/19/19 1545   12/17/19 1800  ertapenem (INVANZ) 1,000 mg in sodium chloride 0.9 % 100 mL IVPB  Status:  Discontinued        1 g 200 mL/hr over 30 Minutes Intravenous Every 24 hours 12/17/19 1041 12/19/19 1341   12/17/19 1045  ertapenem (INVANZ) injection 1,000 mg  Status:  Discontinued        1 g Intramuscular Every 24  hours 12/17/19 1034 12/17/19 1041   12/17/19 0530  ciprofloxacin (CIPRO) IVPB 400 mg        400 mg 200 mL/hr over 60 Minutes Intravenous  Once 12/17/19 0519 12/17/19 0655   12/17/19 0530  metroNIDAZOLE (FLAGYL) IVPB 500 mg        500 mg 100 mL/hr over 60 Minutes Intravenous  Once 12/17/19 0519 12/17/19 0759       Assessment/Plan DM2  Pulm nodule - will need follow up as outpatient   Diverticulitis w/ Microperforation  - This is the patients first episode of diverticulitis. No prior colonoscopy - Cont sips of clears. She is still quite tender on exam. Await AM WBC  - Cont abx - Hopefully patient will improve and be able to avoid surgery. We discussed if she was to worsen/fail to improve she may need a repeat CT scan in later this week (~HD 4) to see if she is developing an intra-abdominal abscess. We discussed if she was to develop an abscess this may  require IR drainage. We also discussed the possibility if she was to worsen, she may require surgery that would likely result in a colostomy. If patient is able to get over this episode without surgery, she will need a colonoscopy in 4-6 weeks.   FEN - Sips of clears, IVF VTE - Scds, lovenox ID - Cirpo/Flagyl 8/14. Invanz 8/14 - 8/16. Zosyn 8/16 >>    LOS: 3 days    [facility] 12/20/2019, 10:13 AM Please see Amion for pager number during day hours 7:00am-4:30pm

## 2019-12-21 LAB — CBC WITH DIFFERENTIAL/PLATELET
Abs Immature Granulocytes: 0.07 10*3/uL (ref 0.00–0.07)
Basophils Absolute: 0 10*3/uL (ref 0.0–0.1)
Basophils Relative: 0 %
Eosinophils Absolute: 0.2 10*3/uL (ref 0.0–0.5)
Eosinophils Relative: 2 %
HCT: 28.7 % — ABNORMAL LOW (ref 36.0–46.0)
Hemoglobin: 9.4 g/dL — ABNORMAL LOW (ref 12.0–15.0)
Immature Granulocytes: 1 %
Lymphocytes Relative: 21 %
Lymphs Abs: 1.6 10*3/uL (ref 0.7–4.0)
MCH: 29.8 pg (ref 26.0–34.0)
MCHC: 32.8 g/dL (ref 30.0–36.0)
MCV: 91.1 fL (ref 80.0–100.0)
Monocytes Absolute: 0.7 10*3/uL (ref 0.1–1.0)
Monocytes Relative: 10 %
Neutro Abs: 5 10*3/uL (ref 1.7–7.7)
Neutrophils Relative %: 66 %
Platelets: 250 10*3/uL (ref 150–400)
RBC: 3.15 MIL/uL — ABNORMAL LOW (ref 3.87–5.11)
RDW: 12.2 % (ref 11.5–15.5)
WBC: 7.6 10*3/uL (ref 4.0–10.5)
nRBC: 0 % (ref 0.0–0.2)

## 2019-12-21 LAB — BASIC METABOLIC PANEL
Anion gap: 7 (ref 5–15)
BUN: <5 mg/dL — ABNORMAL LOW (ref 6–20)
CO2: 22 mmol/L (ref 22–32)
Calcium: 9.1 mg/dL (ref 8.9–10.3)
Chloride: 109 mmol/L (ref 98–111)
Creatinine, Ser: 0.48 mg/dL (ref 0.44–1.00)
GFR calc Af Amer: >60 mL/min (ref >60)
GFR calc non Af Amer: >60 mL/min (ref >60)
Glucose, Bld: 90 mg/dL (ref 70–99)
Potassium: 3.5 mmol/L (ref 3.5–5.1)
Sodium: 138 mmol/L (ref 135–145)

## 2019-12-21 LAB — GLUCOSE, CAPILLARY
Glucose-Capillary: 74 mg/dL (ref 70–99)
Glucose-Capillary: 94 mg/dL (ref 70–99)
Glucose-Capillary: 102 mg/dL — ABNORMAL HIGH (ref 70–99)
Glucose-Capillary: 109 mg/dL — ABNORMAL HIGH (ref 70–99)

## 2019-12-21 NOTE — Progress Notes (Signed)
Subjective: CC: Doing well. Little to no pain. Rates her current pain as a 1/10 in the suprapubic abdomen and LLQ. No n/v. Passing flatus. BM yesterday that was soft.   Objective: Vital signs in last 24 hours: Temp:  [98.1 F (36.7 C)-99 F (37.2 C)] 98.3 F (36.8 C) (08/18 0537) Pulse Rate:  [68-79] 68 (08/18 0537) Resp:  [15-17] 15 (08/18 0537) BP: (113-122)/(70-77) 119/72 (08/18 0537) SpO2:  [99 %-100 %] 99 % (08/18 0537) Last BM Date: 12/20/19  Intake/Output from previous day: 08/17 0701 - 08/18 0700 In: 2008.6 [P.O.:20; I.V.:1800; IV Piggyback:188.6] Out: 2500 [Urine:2500] Intake/Output this shift: No intake/output data recorded.  PE: Gen:  Alert, NAD, pleasant Card:  RRR Pulm:  Normal rate and effort  Abd: Soft,ND, mild tenderness of the suprapubic abdomen and LLQ that is improved from yesterday and without peritonitis,+BS Ext:  No LE edema  Psych: A&Ox3  Skin: no rashes noted, warm and dry   Lab Results:  Recent Labs    12/20/19 1028 12/21/19 0504  WBC 8.8 7.6  HGB 9.7* 9.4*  HCT 29.1* 28.7*  PLT 228 250   BMET Recent Labs    12/20/19 1028 12/21/19 0504  NA 138 138  K 3.7 3.5  CL 107 109  CO2 21* 22  GLUCOSE 95 90  BUN <5* <5*  CREATININE 0.53 0.48  CALCIUM 9.4 9.1   PT/INR No results for input(s): LABPROT, INR in the last 72 hours. CMP     Component Value Date/Time   NA 138 12/21/2019 0504   K 3.5 12/21/2019 0504   CL 109 12/21/2019 0504   CO2 22 12/21/2019 0504   GLUCOSE 90 12/21/2019 0504   BUN <5 (L) 12/21/2019 0504   CREATININE 0.48 12/21/2019 0504   CALCIUM 9.1 12/21/2019 0504   PROT 7.1 12/17/2019 0219   ALBUMIN 3.5 12/17/2019 0219   AST 11 (L) 12/17/2019 0219   ALT 13 12/17/2019 0219   ALKPHOS 51 12/17/2019 0219   BILITOT 0.4 12/17/2019 0219   GFRNONAA >60 12/21/2019 0504   GFRAA >60 12/21/2019 0504   Lipase     Component Value Date/Time   LIPASE 22 12/17/2019 0219       Studies/Results: No results  found.  Anti-infectives: Anti-infectives (From admission, onward)   Start     Dose/Rate Route Frequency Ordered Stop   12/19/19 1600  piperacillin-tazobactam (ZOSYN) IVPB 3.375 g     Discontinue     3.375 g 12.5 mL/hr over 240 Minutes Intravenous Every 8 hours 12/19/19 1545     12/19/19 1500  cefTRIAXone (ROCEPHIN) 2 g in sodium chloride 0.9 % 100 mL IVPB  Status:  Discontinued        2 g 200 mL/hr over 30 Minutes Intravenous Every 24 hours 12/19/19 1341 12/19/19 1545   12/19/19 1500  metroNIDAZOLE (FLAGYL) IVPB 500 mg  Status:  Discontinued        500 mg 100 mL/hr over 60 Minutes Intravenous Every 8 hours 12/19/19 1341 12/19/19 1545   12/17/19 1800  ertapenem (INVANZ) 1,000 mg in sodium chloride 0.9 % 100 mL IVPB  Status:  Discontinued        1 g 200 mL/hr over 30 Minutes Intravenous Every 24 hours 12/17/19 1041 12/19/19 1341   12/17/19 1045  ertapenem (INVANZ) injection 1,000 mg  Status:  Discontinued        1 g Intramuscular Every 24 hours 12/17/19 1034 12/17/19 1041   12/17/19 0530  ciprofloxacin (CIPRO)  IVPB 400 mg        400 mg 200 mL/hr over 60 Minutes Intravenous  Once 12/17/19 0519 12/17/19 0655   12/17/19 0530  metroNIDAZOLE (FLAGYL) IVPB 500 mg        500 mg 100 mL/hr over 60 Minutes Intravenous  Once 12/17/19 0519 12/17/19 0759       Assessment/Plan DM2 - SSI. Intermittent hypoglycemia. Appreciate TRH help Pulm nodule - Per TRH note - No follow-up needed because patient has low risk, non-smoker  Diverticulitis w/ Microperforation  - This is the patients first episode of diverticulitis. No prior colonoscopy - WBC normalized, having bowel function, pain improved. Start CLD - Cont abx - Hopefully patient will improve and be able to avoid surgery. Starting CLD today. If pain worsens or develops fever/leukocystosis, will plan for CT scan to see if she is developing an intra-abdominal abscess. We discussed if she was to develop an abscess this may require IR drainage. We  also discussed the possibility if she was to worsen, she may require surgery that would likely result in a colostomy. If patient is able to get over this episode without surgery, she will need a colonoscopy in 4-6 weeks.  FEN -CLD VTE -Scds, lovenox ID -Cirpo/Flagyl 8/14. Invanz 8/14 - 8/16. Zosyn 8/16 >>   LOS: 4 days    Jillyn Ledger , [facility] 12/21/2019, 9:02 AM Please see Amion for pager number during day hours 7:00am-4:30pm

## 2019-12-21 NOTE — Progress Notes (Signed)
Discussed with general surgery today this morning.  General surgery wants to take the patient into their service.We have signed off

## 2019-12-22 LAB — CBC
HCT: 30.2 % — ABNORMAL LOW (ref 36.0–46.0)
Hemoglobin: 9.9 g/dL — ABNORMAL LOW (ref 12.0–15.0)
MCH: 29.7 pg (ref 26.0–34.0)
MCHC: 32.8 g/dL (ref 30.0–36.0)
MCV: 90.7 fL (ref 80.0–100.0)
Platelets: 308 10*3/uL (ref 150–400)
RBC: 3.33 MIL/uL — ABNORMAL LOW (ref 3.87–5.11)
RDW: 12.3 % (ref 11.5–15.5)
WBC: 9 10*3/uL (ref 4.0–10.5)
nRBC: 0 % (ref 0.0–0.2)

## 2019-12-22 LAB — GLUCOSE, CAPILLARY
Glucose-Capillary: 83 mg/dL (ref 70–99)
Glucose-Capillary: 152 mg/dL — ABNORMAL HIGH (ref 70–99)
Glucose-Capillary: 113 mg/dL — ABNORMAL HIGH (ref 70–99)
Glucose-Capillary: 143 mg/dL — ABNORMAL HIGH (ref 70–99)

## 2019-12-22 MED ORDER — OXYCODONE HCL 5 MG PO TABS: 5.0000 mg | ORAL_TABLET | ORAL | Status: DC | PRN

## 2019-12-22 MED ORDER — HYDROMORPHONE HCL 1 MG/ML IJ SOLN: 1.0000 mg | INTRAMUSCULAR | Status: DC | PRN

## 2019-12-22 NOTE — Progress Notes (Signed)
Subjective: CC: Doing well. Pain is 2/10 or less mainly in the suprapubic abdomen and LLQ. Did not require pain medication in the last 24 hours. Tolerating cld without n/v. Passing flatus. BM this AM that was soft and small. Mobilizing.   Objective: Vital signs in last 24 hours: Temp:  [97.8 F (36.6 C)-98.2 F (36.8 C)] 97.8 F (36.6 C) (08/19 0529) Pulse Rate:  [59-65] 61 (08/19 0529) Resp:  [15-20] 15 (08/19 0529) BP: (116-127)/(74-84) 116/74 (08/19 0529) SpO2:  [99 %-100 %] 99 % (08/19 0529) Last BM Date: 12/21/19  Intake/Output from previous day: 08/18 0701 - 08/19 0700 In: 2818.8 [P.O.:870; I.V.:1798.8; IV Piggyback:150] Out: 3785 [Urine:4050] Intake/Output this shift: No intake/output data recorded.  PE: Gen: Alert, NAD, pleasant Card: RRR Pulm:CTA b/l, normal rate and effort  YIF:OYDX,AJ, mild tenderness of the suprapubic abdomen and LLQ that is improved from yesterday and without peritonitis,+BS Ext: No LE edema Psych: A&Ox3  Skin: no rashes noted, warm and dry  Lab Results:  Recent Labs    12/21/19 0504 12/22/19 0517  WBC 7.6 9.0  HGB 9.4* 9.9*  HCT 28.7* 30.2*  PLT 250 308   BMET Recent Labs    12/20/19 1028 12/21/19 0504  NA 138 138  K 3.7 3.5  CL 107 109  CO2 21* 22  GLUCOSE 95 90  BUN <5* <5*  CREATININE 0.53 0.48  CALCIUM 9.4 9.1   PT/INR No results for input(s): LABPROT, INR in the last 72 hours. CMP     Component Value Date/Time   NA 138 12/21/2019 0504   K 3.5 12/21/2019 0504   CL 109 12/21/2019 0504   CO2 22 12/21/2019 0504   GLUCOSE 90 12/21/2019 0504   BUN <5 (L) 12/21/2019 0504   CREATININE 0.48 12/21/2019 0504   CALCIUM 9.1 12/21/2019 0504   PROT 7.1 12/17/2019 0219   ALBUMIN 3.5 12/17/2019 0219   AST 11 (L) 12/17/2019 0219   ALT 13 12/17/2019 0219   ALKPHOS 51 12/17/2019 0219   BILITOT 0.4 12/17/2019 0219   GFRNONAA >60 12/21/2019 0504   GFRAA >60 12/21/2019 0504   Lipase     Component Value  Date/Time   LIPASE 22 12/17/2019 0219       Studies/Results: No results found.  Anti-infectives: Anti-infectives (From admission, onward)   Start     Dose/Rate Route Frequency Ordered Stop   12/19/19 1600  piperacillin-tazobactam (ZOSYN) IVPB 3.375 g        3.375 g 12.5 mL/hr over 240 Minutes Intravenous Every 8 hours 12/19/19 1545     12/19/19 1500  cefTRIAXone (ROCEPHIN) 2 g in sodium chloride 0.9 % 100 mL IVPB  Status:  Discontinued        2 g 200 mL/hr over 30 Minutes Intravenous Every 24 hours 12/19/19 1341 12/19/19 1545   12/19/19 1500  metroNIDAZOLE (FLAGYL) IVPB 500 mg  Status:  Discontinued        500 mg 100 mL/hr over 60 Minutes Intravenous Every 8 hours 12/19/19 1341 12/19/19 1545   12/17/19 1800  ertapenem (INVANZ) 1,000 mg in sodium chloride 0.9 % 100 mL IVPB  Status:  Discontinued        1 g 200 mL/hr over 30 Minutes Intravenous Every 24 hours 12/17/19 1041 12/19/19 1341   12/17/19 1045  ertapenem (INVANZ) injection 1,000 mg  Status:  Discontinued        1 g Intramuscular Every 24 hours 12/17/19 1034 12/17/19 1041   12/17/19 0530  ciprofloxacin (CIPRO) IVPB 400 mg        400 mg 200 mL/hr over 60 Minutes Intravenous  Once 12/17/19 0519 12/17/19 0655   12/17/19 0530  metroNIDAZOLE (FLAGYL) IVPB 500 mg        500 mg 100 mL/hr over 60 Minutes Intravenous  Once 12/17/19 0519 12/17/19 0759       Assessment/Plan DM2 - SSI. Intermittent hypoglycemia improved since on a diet.  Pulm nodule - Per TRH note - No follow-up needed because patient has low risk, non-smoker  Diverticulitis w/ Microperforation  - This is the patients first episode of diverticulitis. No prior colonoscopy - WBC normalized, having bowel function, pain improved, and tolerating CLD. Adv to FLD. - Cont abx - Hopefully patient will continue to improve and be able to avoid surgery. If pain worsens or develops fever/leukocystosis, will plan for CT scanto see if she is developing an intra-abdominal  abscess.We discussed if she was to develop an abscess this may require IR drainage.We also discussed the possibility if she was to worsen, she may require surgery that would likely result in a colostomy. If patient is able to get over this episode without surgery, she will need a colonoscopy in 4-6 weeks.  FEN -FLD, d/c IVF VTE -Scds, lovenox ID -Cirpo/Flagyl8/14. Invanz 8/14 - 8/16. Zosyn 8/16 >>   LOS: 5 days    Jillyn Ledger , [facility] 12/22/2019, 9:23 AM Please see Amion for pager number during day hours 7:00am-4:30pm

## 2019-12-23 LAB — CULTURE, BLOOD (ROUTINE X 2)
Culture: NO GROWTH
Culture: NO GROWTH
Special Requests: ADEQUATE
Special Requests: ADEQUATE

## 2019-12-23 LAB — GLUCOSE, CAPILLARY: Glucose-Capillary: 105 mg/dL — ABNORMAL HIGH (ref 70–99)

## 2019-12-23 MED ORDER — AMOXICILLIN-POT CLAVULANATE 875-125 MG PO TABS: 1.0000 | ORAL_TABLET | Freq: Two times a day (BID) | ORAL | 0 refills | Status: DC

## 2019-12-23 MED ORDER — OXYCODONE HCL 5 MG PO TABS
5.0000 mg | ORAL_TABLET | Freq: Four times a day (QID) | ORAL | 0 refills | Status: DC | PRN
Start: 2019-12-23 — End: 2020-02-08

## 2019-12-23 NOTE — Discharge Summary (Signed)
Patient ID: Tiffany Stevenson [id number] Mar 15, 1976 44 y.o.  Admit date: 12/17/2019 Discharge date: 12/23/2019  Admitting Diagnosis: Sigmoid diverticulitis with microperforation  Discharge Diagnosis Patient Active Problem List   Diagnosis Date Noted   Acute diverticulitis 12/17/2019   Pulmonary nodule less than 6 cm determined by computed tomography of lung 12/17/2019   Consultants [facility]  Reason for Admission:  This is a 44 year old female with no prior history of diverticulitis who presents with a several day history of right lower quadrant abdominal pain.  She reports that the pain came on gradually and is now sharp and moderate.  She presented to the emergency department.  She was found on CT scan to have findings consistent with sigmoid diverticulitis and a microperforation.  There were a few locules of free air but no abscess.  She reports that she has been constipated.  She denies fevers or chills.  She denies dysuria or hematuria.  Procedures None   Hospital Course:  Patient was admitted and treated with abx and bowel rest. Her WBC normalized and pain improved. Her diet was advanced and tolerated. On 8/20, the patient was voiding well, tolerating diet, ambulating well, pain free, vital signs stable, and felt stable for discharge home. She was d/c on abx. She was recommended to follow up with GI to have a colonoscopy in 6 weeks.   Day of discharge Patient is doing well. No current abdominal pain. Not requiring PRN medications. Tolerating soft diet without n/v. Had chicken for dinner. Passing flatus. Normal BM this AM.   Physical Exam: Gen: Alert, NAD, pleasant Card: RRR Pulm:CTA b/l, normal rate and effort  WUJ:WJXB,JY, NTwithout peritonitis,+BS Ext: No LE edema Psych: A&Ox3  Skin: no rashes noted, warm and dry   Allergies as of 12/23/2019   No Known Allergies     Medication List    TAKE these medications   acetaminophen 325 MG  tablet Commonly known as: TYLENOL Take 650 mg by mouth every 6 (six) hours as needed for mild pain or headache.   amoxicillin-clavulanate 875-125 MG tablet Commonly known as: AUGMENTIN Take 1 tablet by mouth every 12 (twelve) hours.   FISH OIL PO Take 1 capsule by mouth daily.   lisinopril 10 MG tablet Commonly known as: ZESTRIL Take 10 mg by mouth daily.   metFORMIN 500 MG tablet Commonly known as: GLUCOPHAGE Take 500 mg by mouth 2 (two) times daily with a meal.   multivitamin with minerals Tabs tablet Take 1 tablet by mouth daily.   oxyCODONE 5 MG immediate release tablet Commonly known as: Oxy IR/ROXICODONE Take 1 tablet (5 mg total) by mouth every 6 (six) hours as needed for breakthrough pain.         Follow-up Information    Clarita Crane, NP. Schedule an appointment as soon as possible for a visit.   Specialty: Nurse Practitioner Contact information: [address] [place] [address] [phone]        [facility]. Call.   Specialty: Gastroenterology Why: Please call to arrange a follow up appointment to have a colonoscopy  Contact information: [address] [place] [address] [phone]       Karie Soda, MD Follow up.   Specialty: General Surgery Why: After your colonoscopy please call to schedule a follow up appointment with Dr. Leanne Chang information: [address] [place] [address] [phone]              Signed: Leary Roca,  [facility] 12/23/2019, 8:28 AM Please see Amion for pager number during day hours 7:00am-4:30pm

## 2019-12-23 NOTE — Discharge Instructions (Signed)
Diverticulitis  Diverticulitis is infection or inflammation of small pouches (diverticula) in the colon that form due to a condition called diverticulosis. Diverticula can trap stool (feces) and bacteria, causing infection and inflammation. Diverticulitis may cause severe stomach pain and diarrhea. It may lead to tissue damage in the colon that causes bleeding. The diverticula may also burst (rupture) and cause infected stool to enter other areas of the abdomen. Complications of diverticulitis can include:  Bleeding.  Severe infection.  Severe pain.  Rupture (perforation) of the colon.  Blockage (obstruction) of the colon. What are the causes? This condition is caused by stool becoming trapped in the diverticula, which allows bacteria to grow in the diverticula. This leads to inflammation and infection. What increases the risk? You are more likely to develop this condition if:  You have diverticulosis. The risk for diverticulosis increases if: ? You are overweight or obese. ? You use tobacco products. ? You do not get enough exercise.  You eat a diet that does not include enough fiber. High-fiber foods include fruits, vegetables, beans, nuts, and whole grains. What are the signs or symptoms? Symptoms of this condition may include:  Pain and tenderness in the abdomen. The pain is normally located on the left side of the abdomen, but it may occur in other areas.  Fever and chills.  Bloating.  Cramping.  Nausea.  Vomiting.  Changes in bowel routines.  Blood in your stool. How is this diagnosed? This condition is diagnosed based on:  Your medical history.  A physical exam.  Tests to make sure there is nothing else causing your condition. These tests may include: ? Blood tests. ? Urine tests. ? Imaging tests of the abdomen, including X-rays, ultrasounds, MRIs, or CT scans. How is this treated? Most cases of this condition are mild and can be treated at home.  Treatment may include:  Taking over-the-counter pain medicines.  Following a clear liquid diet.  Taking antibiotic medicines by mouth.  Rest. More severe cases may need to be treated at a hospital. Treatment may include:  Not eating or drinking.  Taking prescription pain medicine.  Receiving antibiotic medicines through an IV tube.  Receiving fluids and nutrition through an IV tube.  Surgery. When your condition is under control, your health care provider may recommend that you have a colonoscopy. This is an exam to look at the entire large intestine. During the exam, a lubricated, bendable tube is inserted into the anus and then passed into the rectum, colon, and other parts of the large intestine. A colonoscopy can show how severe your diverticula are and whether something else may be causing your symptoms. Follow these instructions at home: Medicines  Take over-the-counter and prescription medicines only as told by your health care provider. These include fiber supplements, probiotics, and stool softeners.  If you were prescribed an antibiotic medicine, take it as told by your health care provider. Do not stop taking the antibiotic even if you start to feel better.  Do not drive or use heavy machinery while taking prescription pain medicine. General instructions   Follow a full liquid diet or another diet as directed by your health care provider. After your symptoms improve, your health care provider may tell you to change your diet. He or she may recommend that you eat a diet that contains at least 25 g (25 grams) of fiber daily. Fiber makes it easier to pass stool. Healthy sources of fiber include: ? Berries. One cup contains 4-8 grams of   fiber. ? Beans or lentils. One half cup contains 5-8 grams of fiber. ? Green vegetables. One cup contains 4 grams of fiber.  Exercise for at least 30 minutes, 3 times each week. You should exercise hard enough to raise your heart rate and  break a sweat.  Keep all follow-up visits as told by your health care provider. This is important. You may need a colonoscopy. Contact a health care provider if:  Your pain does not improve.  You have a hard time drinking or eating food.  Your bowel movements do not return to normal. Get help right away if:  Your pain gets worse.  Your symptoms do not get better with treatment.  Your symptoms suddenly get worse.  You have a fever.  You vomit more than one time.  You have stools that are bloody, black, or tarry. Summary  Diverticulitis is infection or inflammation of small pouches (diverticula) in the colon that form due to a condition called diverticulosis. Diverticula can trap stool (feces) and bacteria, causing infection and inflammation.  You are at higher risk for this condition if you have diverticulosis and you eat a diet that does not include enough fiber.  Most cases of this condition are mild and can be treated at home. More severe cases may need to be treated at a hospital.  When your condition is under control, your health care provider may recommend that you have an exam called a colonoscopy. This exam can show how severe your diverticula are and whether something else may be causing your symptoms. This information is not intended to replace advice given to you by your health care provider. Make sure you discuss any questions you have with your health care provider. Document Revised: 04/03/2017 Document Reviewed: 05/24/2016 Elsevier Patient Education  [place] Please follow this eating plan for the next 3 weeks A soft-food eating plan includes foods that are safe and easy to chew and swallow. Your health care provider or dietitian can help you find foods and flavors that fit into this plan. Follow this plan until your health care provider or dietitian says it is safe to start eating other foods and food textures. What are tips for  following this plan? General guidelines   Take small bites of food, or cut food into pieces about  inch or smaller. Bite-sized pieces of food are easier to chew and swallow.  Eat moist foods. Avoid overly dry foods.  Avoid foods that: ? Are difficult to swallow, such as dry, chunky, crispy, or sticky foods. ? Are difficult to chew, such as hard, tough, or stringy foods. ? Contain nuts, seeds, or fruits.  Follow instructions from your dietitian about the types of liquids that are safe for you to swallow. You may be allowed to have: ? Thick liquids only. This includes only liquids that are thicker than honey. ? Thin and thick liquids. This includes all beverages and foods that become liquid at room temperature.  To make thick liquids: ? Purchase a commercial liquid thickening powder. These are available at grocery stores and pharmacies. ? Mix the thickener into liquids according to instructions on the label. ? Purchase ready-made thickened liquids. ? Thicken soup by pureeing, straining to remove chunks, and adding flour, potato flakes, or corn starch. ? Add commercial thickener to foods that become liquid at room temperature, such as milk shakes, yogurt, ice cream, gelatin, and sherbet.  Ask your health care provider whether you need to  take a fiber supplement. Cooking  Cook meats so they stay tender and moist. Use methods like braising, stewing, or baking in liquid.  Cook vegetables and fruit until they are soft enough to be mashed with a fork.  Peel soft, fresh fruits such as peaches, nectarines, and melons.  When making soup, make sure chunks of meat and vegetables are smaller than  inch.  Reheat leftover foods slowly so that a tough crust does not form. What foods are allowed? The items listed below may not be a complete list. Talk with your dietitian about what dietary choices are best for you. Grains Breads, muffins, pancakes, or waffles moistened with syrup, jelly, or  butter. Dry cereals well-moistened with milk. Moist, cooked cereals. Well-cooked pasta and rice. Vegetables All soft-cooked vegetables. Shredded lettuce. Fruits All canned and cooked fruits. Soft, peeled fresh fruits. Strawberries. Dairy Milk. Cream. Yogurt. Cottage cheese. Soft cheese without the rind. Meats and other protein foods Tender, moist ground meat, poultry, or fish. Meat cooked in gravy or sauces. Eggs. Sweets and desserts Ice cream. Milk shakes. Sherbet. Pudding. Fats and oils Butter. Margarine. Olive, canola, sunflower, and grapeseed oil. Smooth salad dressing. Smooth cream cheese. Mayonnaise. Gravy. What foods are not allowed? The items listed bemay not be a complete list. Talk with your dietitian about what dietary choices are best for you. Grains Coarse or dry cereals, such as bran, granola, and shredded wheat. Tough or chewy crusty breads, such as [place] bread or baguettes. Breads with nuts, seeds, or fruit. Vegetables All raw vegetables. Cooked corn. Cooked vegetables that are tough or stringy. Tough, crisp, fried potatoes and potato skins. Fruits Fresh fruits with skins or seeds, or both, such as apples, pears, and grapes. Stringy, high-pulp fruits, such as papaya, pineapple, coconut, and mango. Fruit leather and all dried fruit. Dairy Yogurt with nuts or coconut. Meats and other protein foods Hard, dry sausages. Dry meat, poultry, or fish. Meats with gristle. Fish with bones. Fried meat or fish. Lunch meat and hotdogs. Nuts and seeds. Chunky peanut butter or other nut butters. Sweets and desserts Cakes or cookies that are very dry or chewy. Desserts with dried fruit, nuts, or coconut. Fried pastries. Very rich pastries. Fats and oils Cream cheese with fruit or nuts. Salad dressings with seeds or chunks. Summary  A soft-food eating plan includes foods that are safe and easy to swallow. Generally, the foods should be soft enough to be mashed with a fork.  Avoid  foods that are dry, hard to chew, crunchy, sticky, stringy, or crispy.  Ask your health care provider whether you need to thicken your liquids and if you need to take a fiber supplement. This information is not intended to replace advice given to you by your health care provider. Make sure you discuss any questions you have with your health care provider. Document Revised: 08/12/2018 Document Reviewed: 06/24/2016 Elsevier Patient Education  [place].   High-Fiber Diet Please follow this eating plan starting 9/10 Fiber, also called dietary fiber, is a type of carbohydrate that is found in fruits, vegetables, whole grains, and beans. A high-fiber diet can have many health benefits. Your health care provider may recommend a high-fiber diet to help:  Prevent constipation. Fiber can make your bowel movements more regular.  Lower your cholesterol.  Relieve the following conditions: ? Swelling of veins in the anus (hemorrhoids). ? Swelling and irritation (inflammation) of specific areas of the digestive tract (uncomplicated diverticulosis). ? A problem of the large intestine (colon)  that sometimes causes pain and diarrhea (irritable bowel syndrome, IBS).  Prevent overeating as part of a weight-loss plan.  Prevent heart disease, type 2 diabetes, and certain cancers. What is my plan? The recommended daily fiber intake in grams (g) includes:  38 g for men age 16 or younger.  30 g for men over age 55.  68 g for women age 28 or younger.  21 g for women over age 55. You can get the recommended daily intake of dietary fiber by:  Eating a variety of fruits, vegetables, grains, and beans.  Taking a fiber supplement, if it is not possible to get enough fiber through your diet. What do I need to know about a high-fiber diet?  It is better to get fiber through food sources rather than from fiber supplements. There is not a lot of research about how effective supplements are.  Always  check the fiber content on the nutrition facts label of any prepackaged food. Look for foods that contain 5 g of fiber or more per serving.  Talk with a diet and nutrition specialist (dietitian) if you have questions about specific foods that are recommended or not recommended for your medical condition, especially if those foods are not listed below.  Gradually increase how much fiber you consume. If you increase your intake of dietary fiber too quickly, you may have bloating, cramping, or gas.  Drink plenty of water. Water helps you to digest fiber. What are tips for following this plan?  Eat a wide variety of high-fiber foods.  Make sure that half of the grains that you eat each day are whole grains.  Eat breads and cereals that are made with whole-grain flour instead of refined flour or white flour.  Eat brown rice, bulgur wheat, or millet instead of white rice.  Start the day with a breakfast that is high in fiber, such as a cereal that contains 5 g of fiber or more per serving.  Use beans in place of meat in soups, salads, and pasta dishes.  Eat high-fiber snacks, such as berries, raw vegetables, nuts, and popcorn.  Choose whole fruits and vegetables instead of processed forms like juice or sauce. What foods can I eat?  Fruits Berries. Pears. Apples. Oranges. Avocado. Prunes and raisins. Dried figs. Vegetables Sweet potatoes. Spinach. Kale. Artichokes. Cabbage. Broccoli. Cauliflower. Green peas. Carrots. Squash. Grains Whole-grain breads. Multigrain cereal. Oats and oatmeal. Brown rice. Barley. Bulgur wheat. [place]. Quinoa. Bran muffins. Popcorn. Rye wafer crackers. Meats and other proteins [place], kidney, and pinto beans. Soybeans. Split peas. Lentils. Nuts and seeds. Dairy Fiber-fortified yogurt. Beverages Fiber-fortified soy milk. Fiber-fortified orange juice. Other foods Fiber bars. The items listed above may not be a complete list of recommended foods and beverages.  Contact a dietitian for more options. What foods are not recommended? Fruits Fruit juice. Cooked, strained fruit. Vegetables Fried potatoes. Canned vegetables. Well-cooked vegetables. Grains White bread. Pasta made with refined flour. White rice. Meats and other proteins Fatty cuts of meat. Fried chicken or fried fish. Dairy Milk. Yogurt. Cream cheese. Sour cream. Fats and oils Butters. Beverages Soft drinks. Other foods Cakes and pastries. The items listed above may not be a complete list of foods and beverages to avoid. Contact a dietitian for more information. Summary  Fiber is a type of carbohydrate. It is found in fruits, vegetables, whole grains, and beans.  There are many health benefits of eating a high-fiber diet, such as preventing constipation, lowering blood cholesterol, helping with weight  loss, and reducing your risk of heart disease, diabetes, and certain cancers.  Gradually increase your intake of fiber. Increasing too fast can result in cramping, bloating, and gas. Drink plenty of water while you increase your fiber.  The best sources of fiber include whole fruits and vegetables, whole grains, nuts, seeds, and beans. This information is not intended to replace advice given to you by your health care provider. Make sure you discuss any questions you have with your health care provider. Document Revised: 02/23/2017 Document Reviewed: 02/23/2017 Elsevier Patient Education  [employer].  On your CT you were found to have a 3 mm left lower lobe pulmonary nodule, indeterminate. No follow-up needed if you are low-risk (you do not smoke). A non-contrast chest CT can be considered in 12 months if you are high-risk (you smoke). Please contact you PCP for follow up

## 2019-12-23 NOTE — Plan of Care (Signed)
  Problem: Clinical Measurements: Goal: Ability to maintain clinical measurements within normal limits will improve Outcome: Completed/Met Goal: Will remain free from infection Outcome: Completed/Met Goal: Diagnostic test results will improve Outcome: Completed/Met   Problem: Nutrition: Goal: Adequate nutrition will be maintained Outcome: Completed/Met   Problem: Coping: Goal: Level of anxiety will decrease Outcome: Completed/Met   Problem: Elimination: Goal: Will not experience complications related to bowel motility Outcome: Completed/Met Goal: Will not experience complications related to urinary retention Outcome: Completed/Met   Problem: Safety: Goal: Ability to remain free from injury will improve Outcome: Completed/Met   Problem: Skin Integrity: Goal: Risk for impaired skin integrity will decrease Outcome: Completed/Met   Problem: Nutritional: Goal: Progress toward achieving an optimal weight will improve Outcome: Completed/Met

## 2020-01-02 ENCOUNTER — Other Ambulatory Visit (HOSPITAL_COMMUNITY): Payer: Self-pay | Admitting: Nurse Practitioner

## 2020-01-02 DIAGNOSIS — Z1231 Encounter for screening mammogram for malignant neoplasm of breast: Secondary | ICD-10-CM

## 2020-01-13 ENCOUNTER — Other Ambulatory Visit: Payer: Self-pay

## 2020-01-13 ENCOUNTER — Ambulatory Visit (HOSPITAL_COMMUNITY)
Admission: RE | Admit: 2020-01-13 | Discharge: 2020-01-13 | Disposition: A | Discharge status: Home / Self Care | Payer: Self-pay | Source: Ambulatory Visit | Attending: Nurse Practitioner | Admitting: Nurse Practitioner

## 2020-01-13 DIAGNOSIS — Z1231 Encounter for screening mammogram for malignant neoplasm of breast: Secondary | ICD-10-CM | POA: Insufficient documentation

## 2020-02-08 ENCOUNTER — Encounter: Payer: Self-pay | Admitting: Nurse Practitioner

## 2020-02-08 ENCOUNTER — Ambulatory Visit (INDEPENDENT_AMBULATORY_CARE_PROVIDER_SITE_OTHER): Payer: Self-pay | Admitting: Nurse Practitioner

## 2020-02-08 VITALS — BP 116/80 | HR 76 | Ht 64.0 in | Wt 139.0 lb

## 2020-02-08 DIAGNOSIS — K5732 Diverticulitis of large intestine without perforation or abscess without bleeding: Secondary | ICD-10-CM

## 2020-02-08 NOTE — Patient Instructions (Signed)
If you are age 43 or older, your body mass index should be between 23-30. Your Body mass index is 23.86 kg/m. If this is out of the aforementioned range listed, please consider follow up with your Primary Care Provider.  If you are age 62 or younger, your body mass index should be between 19-25. Your Body mass index is 23.86 kg/m. If this is out of the aformentioned range listed, please consider follow up with your Primary Care Provider.   Start Miralax 1 capful daily in 8 ounces of water.  Drink at least 48-64 ounces of water daily.   You have been scheduled for a colonoscopy. Please follow written instructions given to you at your visit today.  Please pick up your prep supplies at the pharmacy within the next 1-3 days. If you use inhalers (even only as needed), please bring them with you on the day of your procedure.  Due to recent changes in healthcare laws, you may see the results of your imaging and laboratory studies on MyChart before your provider has had a chance to review them.  We understand that in some cases there may be results that are confusing or concerning to you. Not all laboratory results come back in the same time frame and the provider may be waiting for multiple results in order to interpret others.  Please give [place] 48 hours in order for your provider to thoroughly review all the results before contacting the office for clarification of your results.

## 2020-02-08 NOTE — Progress Notes (Signed)
Reviewed and agree with management plans. ? ?Sydny Schnitzler L. Jozlyn Schatz, MD, MPH  ?

## 2020-02-08 NOTE — Progress Notes (Signed)
ASSESSMENT AND PLAN     # Sigmoid diverticulitis with microperforation 12/17/19. This was her first and only episode --Completed antibiotics prescribed at discharge. She has no abdominal pain, nontender on exam --Needs colonoscopy to exclude underlying colon neoplasm. Patient will be scheduled for a colonoscopy. The risks and benefits of colonoscopy with possible polypectomy / biopsies were discussed and the patient agrees to proceed.  --She is to follow up with [facility] after colonoscopy to discuss segmental sigmoid resection  # Chronic constipation --Start 1 capful of Miralax in 8 oz of water daily --Needs 48- 64 oz water daily.    HISTORY OF PRESENT ILLNESS     Primary Gastroenterologist : [facility] - Thornton Park, MD  Chief Complaint : diverticulitis.   Tiffany Stevenson is a 44 y.o. female with PMH / [place] significant for,  but not necessarily limited to: chronic constipation, complicated diverticulitis, anddiabetes   Patient referred by Michael Boston, MD with [facility] for colonoscopy post episode of complicated diverticulitis in August. She was hospitalized for several days, improved with IV antibiotics  This was her first episode of diverticulitis. No [place] of colon cancer. No history of blood in stool.   She gives a history of chronic constipation with decreased frequency of bowel movements. She tried fiber at one time but didn't take it on a regular basis so wasn't very helpful. Tried Miralax which helped but someone told her not to take it more than 7 days.    Data Reviewed: CT scan 12/17/19 IMPRESSION: 1. Findings consistent with acute sigmoid diverticulitis. Few locules of free air within the lower abdomen/pelvis consistent with associated micro perforation. 2. No other acute intra-abdominal or pelvic process identified. Normal appendix. 3. 3 mm left lower lobe pulmonary nodule, indeterminate. No follow-up needed if patient is low-risk. Non-contrast chest CT can be  considered in 12 months if patient is high-risk. This recommendation follows the consensus statement: Guidelines for Management of Incidental Pulmonary Nodules Detected on CT    Previous Endoscopic Evaluations / Pertinent Studies:   Past Medical History:  Diagnosis Date  . Diabetes mellitus without complication ([place])      History reviewed. No pertinent surgical history. Family History  Problem Relation Age of Onset  . Diabetes Mother   . Diabetes Father   . Colon cancer Neg Hx   . Stomach cancer Neg Hx   . Pancreatic cancer Neg Hx    Social History   Tobacco Use  . Smoking status: Never Smoker  . Smokeless tobacco: Never Used  Vaping Use  . Vaping Use: Never used  Substance Use Topics  . Alcohol use: Not Currently    Comment: occasionally  . Drug use: Never   Current Outpatient Medications  Medication Sig Dispense Refill  . acetaminophen (TYLENOL) 325 MG tablet Take 650 mg by mouth every 6 (six) hours as needed for mild pain or headache.    . lisinopril (ZESTRIL) 10 MG tablet Take 10 mg by mouth daily.    . metFORMIN (GLUCOPHAGE) 500 MG tablet Take 500 mg by mouth 2 (two) times daily with a meal.    . Multiple Vitamin (MULTIVITAMIN WITH MINERALS) TABS tablet Take 1 tablet by mouth daily.    . Omega-3 Fatty Acids (FISH OIL PO) Take 1 capsule by mouth daily.     No current facility-administered medications for this visit.   No Known Allergies   Review of Systems: All systems reviewed and negative except where noted in HPI.   PHYSICAL EXAM :  Wt Readings from Last 3 Encounters:  02/08/20 139 lb (63 kg)  12/17/19 143 lb (64.9 kg)    BP 116/80   Pulse 76   Ht 5\' 4"  (1.626 m)   Wt 139 lb (63 kg)   BMI 23.86 kg/m  Constitutional:  Pleasant female in no acute distress. Psychiatric: Normal mood and affect. Behavior is normal. EENT: Pupils normal.  Conjunctivae are normal. No scleral icterus. Neck supple.  Cardiovascular: Normal rate, regular rhythm. No  edema Pulmonary/chest: Effort normal and breath sounds normal. No wheezing, rales or rhonchi. Abdominal: Soft, nondistended, nontender. Bowel sounds active throughout. There are no masses palpable. No hepatomegaly. Neurological: Alert and oriented to person place and time. Skin: Skin is warm and dry. No rashes noted.  Tye Savoy, NP  02/08/2020, 1:37 PM   Cc:  Michael Boston, MD [facility]

## 2020-02-17 ENCOUNTER — Other Ambulatory Visit: Payer: Self-pay

## 2020-02-17 MED ORDER — SUTAB 1479-225-188 MG PO TABS: ORAL_TABLET | ORAL | 0 refills | Status: DC

## 2020-02-17 NOTE — Addendum Note (Signed)
Addended by: Marlon Pel on: 02/17/2020 08:41 AM   Modules accepted: Orders

## 2020-02-20 ENCOUNTER — Other Ambulatory Visit: Payer: Self-pay

## 2020-02-20 MED ORDER — SUTAB 1479-225-188 MG PO TABS: 24.0000 | ORAL_TABLET | ORAL | 0 refills | Status: DC

## 2020-02-20 NOTE — Addendum Note (Signed)
Addended by: Audrea Muscat on: 02/20/2020 11:48 AM   Modules accepted: Orders

## 2020-02-24 ENCOUNTER — Other Ambulatory Visit: Payer: Self-pay

## 2020-02-24 ENCOUNTER — Ambulatory Visit (AMBULATORY_SURGERY_CENTER): Payer: Self-pay | Admitting: Gastroenterology

## 2020-02-24 ENCOUNTER — Encounter: Payer: Self-pay | Admitting: Gastroenterology

## 2020-02-24 VITALS — BP 111/66 | HR 52 | Temp 98.9°F | Resp 10 | Ht 64.0 in | Wt 139.0 lb

## 2020-02-24 DIAGNOSIS — K5732 Diverticulitis of large intestine without perforation or abscess without bleeding: Secondary | ICD-10-CM

## 2020-02-24 DIAGNOSIS — D123 Benign neoplasm of transverse colon: Secondary | ICD-10-CM

## 2020-02-24 DIAGNOSIS — K635 Polyp of colon: Secondary | ICD-10-CM

## 2020-02-24 MED ORDER — SODIUM CHLORIDE 0.9 % IV SOLN: 500.0000 mL | Freq: Once | INTRAVENOUS | Status: DC

## 2020-02-24 NOTE — Progress Notes (Signed)
Called to room to assist during endoscopic procedure.  Patient ID and intended procedure confirmed with present staff. Received instructions for my participation in the procedure from the performing physician.  

## 2020-02-24 NOTE — Op Note (Signed)
[place] Patient Name: Tiffany Stevenson Procedure Date: 02/24/2020 11:02 AM MRN: [id number] Endoscopist: Thornton Park MD, MD Age: 44 Referring MD:  Date of Birth: 10-18-1975 Gender: Female Account #: [id number] Procedure:                Colonoscopy Indications:              Follow-up of diverticulitis                           CT showing sigmoid diverticulitis 12/2019 Medicines:                Monitored Anesthesia Care Procedure:                Pre-Anesthesia Assessment:                           - Prior to the procedure, a History and Physical                            was performed, and patient medications and                            allergies were reviewed. The patient's tolerance of                            previous anesthesia was also reviewed. The risks                            and benefits of the procedure and the sedation                            options and risks were discussed with the patient.                            All questions were answered, and informed consent                            was obtained. Prior Anticoagulants: The patient has                            taken no previous anticoagulant or antiplatelet                            agents. ASA Grade Assessment: II - A patient with                            mild systemic disease. After reviewing the risks                            and benefits, the patient was deemed in                            satisfactory condition to undergo the procedure.  After obtaining informed consent, the colonoscope                            was passed under direct vision. Throughout the                            procedure, the patient's blood pressure, pulse, and                            oxygen saturations were monitored continuously. The                            Colonoscope was introduced through the anus and                            advanced to the 3 cm into the  ileum. A second                            forward view of the right colon was perofmred. The                            colonoscopy was performed without difficulty. The                            patient tolerated the procedure well. The quality                            of the bowel preparation was good. The terminal                            ileum, ileocecal valve, appendiceal orifice, and                            rectum were photographed. Scope In: 11:11:32 AM Scope Out: 11:28:41 AM Scope Withdrawal Time: 0 hours 12 minutes 47 seconds  Total Procedure Duration: 0 hours 17 minutes 9 seconds  Findings:                 The perianal and digital rectal examinations were                            normal.                           Multiple small and large-mouthed diverticula were                            found throughout the colon, although the majority                            are in the sigmoid colon. No active diverticulitis.                           A 1 mm polyp was found in the mid transverse colon.  The polyp was sessile. The polyp was removed with a                            cold biopsy forceps. Resection and retrieval were                            complete. Estimated blood loss was minimal.                           The exam was otherwise without abnormality on                            direct and retroflexion views. Complications:            No immediate complications. Estimated blood loss:                            Minimal. Estimated Blood Loss:     Estimated blood loss was minimal. Impression:               - Diverticulosis predominantly in the sigmoid                            colon. No diverticulitis.                           - One 1 mm polyp in the mid transverse colon,                            removed with a cold biopsy forceps. Resected and                            retrieved.                           - The examination was  otherwise normal on direct                            and retroflexion views. Recommendation:           - Patient has a contact number available for                            emergencies. The signs and symptoms of potential                            delayed complications were discussed with the                            patient. Return to normal activities tomorrow.                            Written discharge instructions were provided to the                            patient.                           -  Follow a high fiber diet. Drink at least 64                            ounces of water daily. Add a daily stool bulking                            agent such as psyllium (an exampled would be                            Metamucil). There are no dietary restrictions with                            diverticulosis.                           - Continue present medications.                           - No aspirin, ibuprofen, naproxen, or other                            non-steroidal anti-inflammatory drugs as these have                            been associated with diverticulitis.                           - Await pathology results.                           - Repeat colonoscopy date to be determined after                            pending pathology results are reviewed for                            surveillance.                           - Emerging evidence supports eating a diet of                            fruits, vegetables, grains, calcium, and yogurt                            while reducing red meat and alcohol may reduce the                            risk of colon cancer. Thornton Park MD, MD 02/24/2020 11:39:16 AM This report has been signed electronically.

## 2020-02-24 NOTE — Progress Notes (Signed)
Vitals-CW  Patient states that she hasn't taken her CBG in 6 months, and that she doesn't need any glucose water not glucose pills.  History reviewed.

## 2020-02-24 NOTE — Progress Notes (Signed)
PT taken to PACU. Monitors in place. VSS. Report given to RN. 

## 2020-02-24 NOTE — Patient Instructions (Signed)
Handouts Provided:  Polyps and Diverticulosis  NO aspirin, ibuprofen, naproxen, or other non-steroidal anti-inflammatory drugs  YOU HAD AN ENDOSCOPIC PROCEDURE TODAY AT [facility]:   Refer to the procedure report that was given to you for any specific questions about what was found during the examination.  If the procedure report does not answer your questions, please call your gastroenterologist to clarify.  If you requested that your care partner not be given the details of your procedure findings, then the procedure report has been included in a sealed envelope for you to review at your convenience later.  YOU SHOULD EXPECT: Some feelings of bloating in the abdomen. Passage of more gas than usual.  Walking can help get rid of the air that was put into your GI tract during the procedure and reduce the bloating. If you had a lower endoscopy (such as a colonoscopy or flexible sigmoidoscopy) you may notice spotting of blood in your stool or on the toilet paper. If you underwent a bowel prep for your procedure, you may not have a normal bowel movement for a few days.  Please Note:  You might notice some irritation and congestion in your nose or some drainage.  This is from the oxygen used during your procedure.  There is no need for concern and it should clear up in a day or so.  SYMPTOMS TO REPORT IMMEDIATELY:   Following lower endoscopy (colonoscopy or flexible sigmoidoscopy):  Excessive amounts of blood in the stool  Significant tenderness or worsening of abdominal pains  Swelling of the abdomen that is new, acute  Fever of 100F or higher  For urgent or emergent issues, a gastroenterologist can be reached at any hour by calling [phone]. Do not use MyChart messaging for urgent concerns.    DIET:  We do recommend a small meal at first, but then you may proceed to your regular diet.  Drink plenty of fluids but you should avoid alcoholic beverages for 24  hours.  ACTIVITY:  You should plan to take it easy for the rest of today and you should NOT DRIVE or use heavy machinery until tomorrow (because of the sedation medicines used during the test).    FOLLOW UP: Our staff will call the number listed on your records 48-72 hours following your procedure to check on you and address any questions or concerns that you may have regarding the information given to you following your procedure. If we do not reach you, we will leave a message.  We will attempt to reach you two times.  During this call, we will ask if you have developed any symptoms of COVID 19. If you develop any symptoms (ie: fever, flu-like symptoms, shortness of breath, cough etc.) before then, please call [phone].  If you test positive for Covid 19 in the 2 weeks post procedure, please call and report this information to [place].    If any biopsies were taken you will be contacted by phone or by letter within the next 1-3 weeks.  Please call us at [phone] if you have not heard about the biopsies in 3 weeks.    SIGNATURES/CONFIDENTIALITY: You and/or your care partner have signed paperwork which will be entered into your electronic medical record.  These signatures attest to the fact that that the information above on your After Visit Summary has been reviewed and is understood.  Full responsibility of the confidentiality of this discharge information lies with you and/or your care-partner.

## 2020-02-28 ENCOUNTER — Telehealth: Payer: Self-pay

## 2020-02-28 NOTE — Telephone Encounter (Signed)
°  Follow up Call-  Call back number 02/24/2020  Post procedure Call Back phone  # [phone]  Permission to leave phone message Yes  Some recent data might be hidden     Patient questions:  Do you have a fever, pain , or abdominal swelling? No. Pain Score  0 *  Have you tolerated food without any problems? Yes.    Have you been able to return to your normal activities? Yes.    Do you have any questions about your discharge instructions: Diet   No. Medications  No. Follow up visit  No.  Do you have questions or concerns about your Care? No.  Actions: * If pain score is 4 or above: 1. No action needed, pain <4.Have you developed a fever since your procedure? no  2.   Have you had an respiratory symptoms (SOB or cough) since your procedure? no  3.   Have you tested positive for COVID 19 since your procedure no  4.   Have you had any family members/close contacts diagnosed with the COVID 19 since your procedure?  no   If yes to any of these questions please route to Joylene John, RN and Joella Prince, RN

## 2020-03-01 ENCOUNTER — Encounter: Payer: Self-pay | Admitting: Gastroenterology

## 2021-06-14 IMAGING — MG DIGITAL SCREENING BILAT W/ TOMO W/ CAD
8 series · 9 of 24 positions shown · non-contrast
Comparison: Previous exam(s).

CLINICAL DATA: Screening.

EXAM:
DIGITAL SCREENING BILATERAL MAMMOGRAM WITH TOMO AND CAD

[R MLO synth-2D]
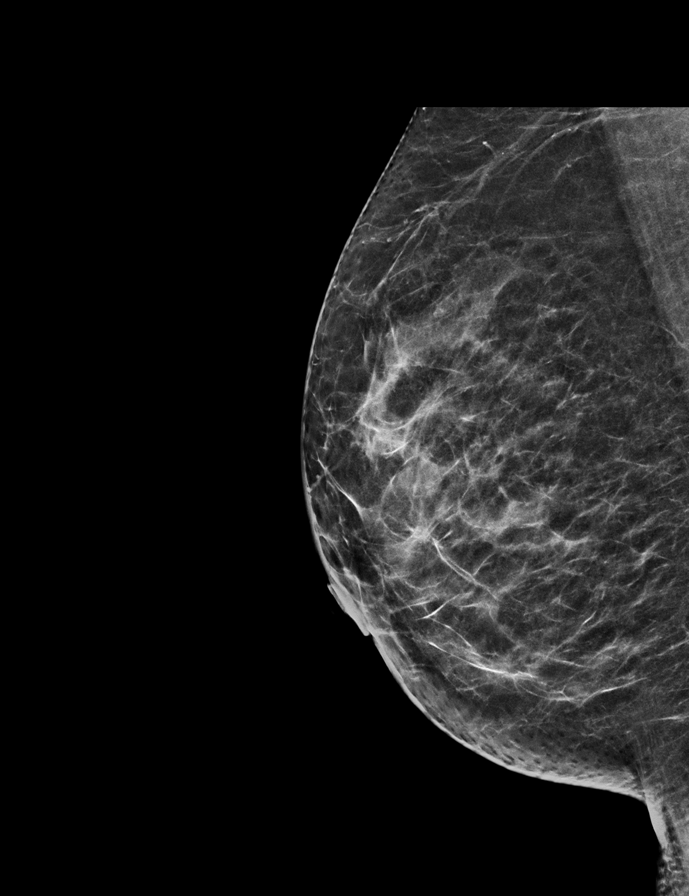

[L CC synth-2D]
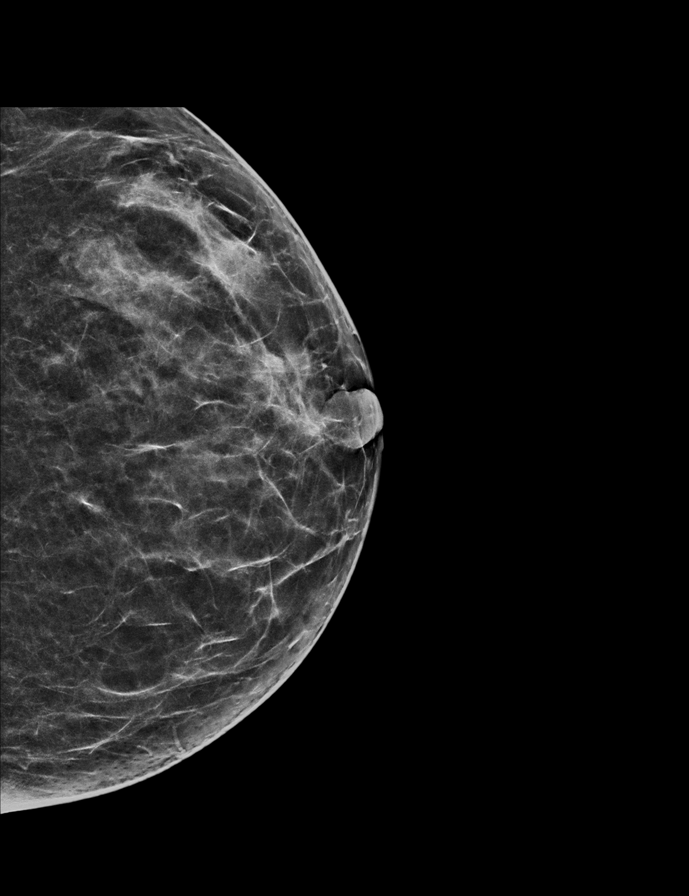

[R CC synth-2D]
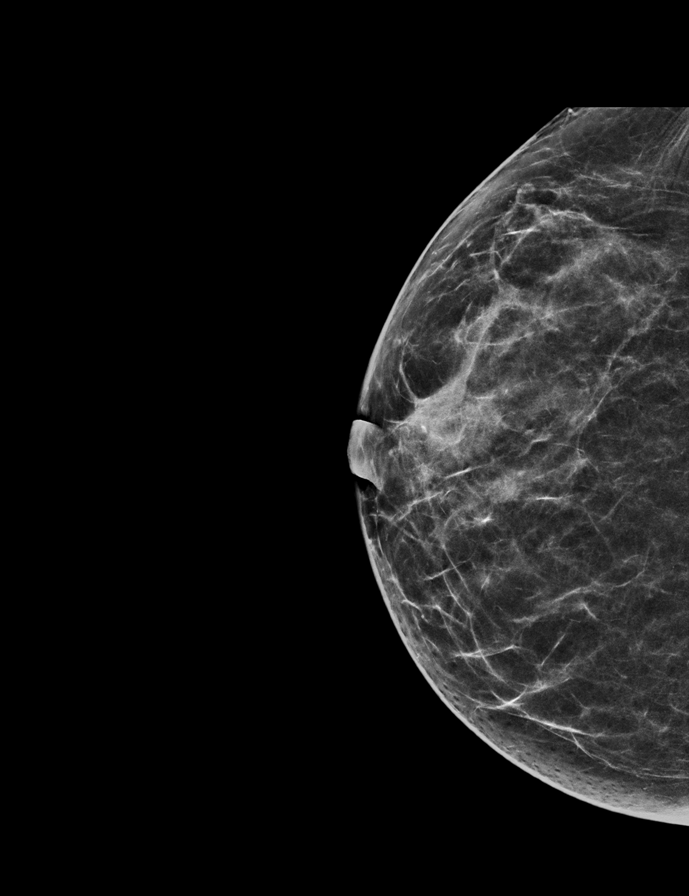

[L MLO synth-2D]
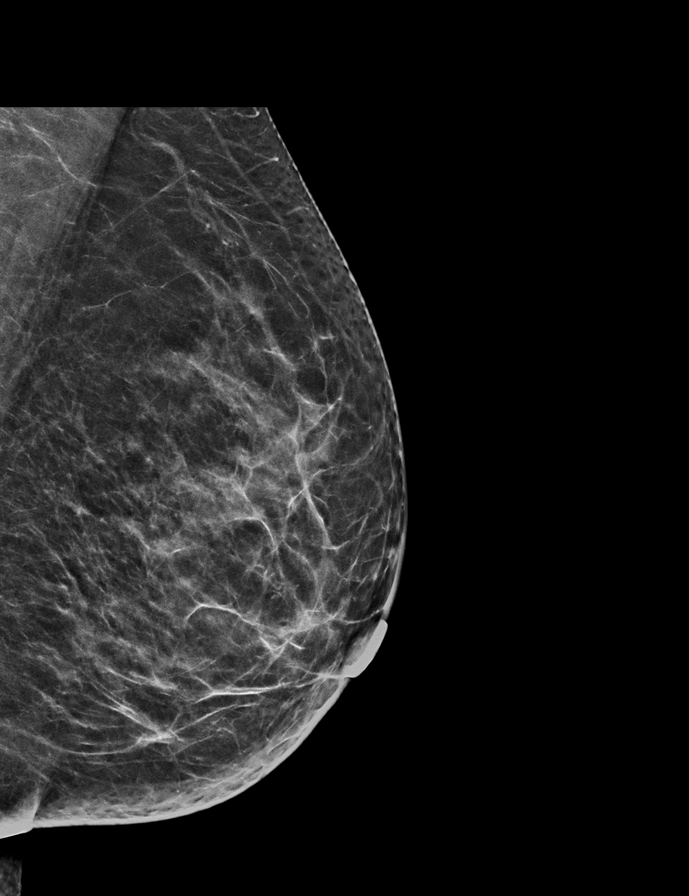

[R MLO tomo · 2 of 60 frames shown]
[frame 20/60]
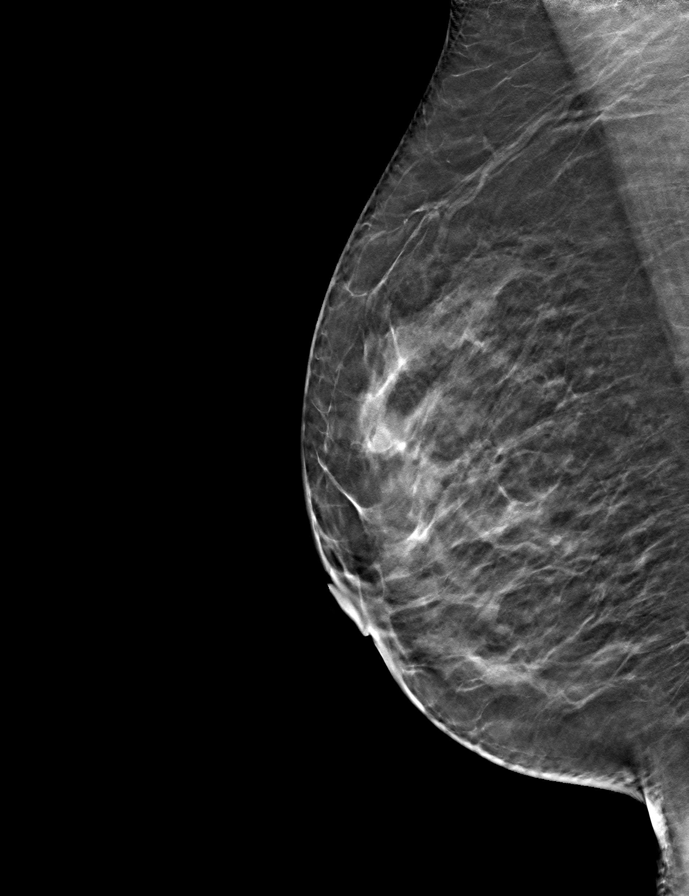
[frame 31/60]
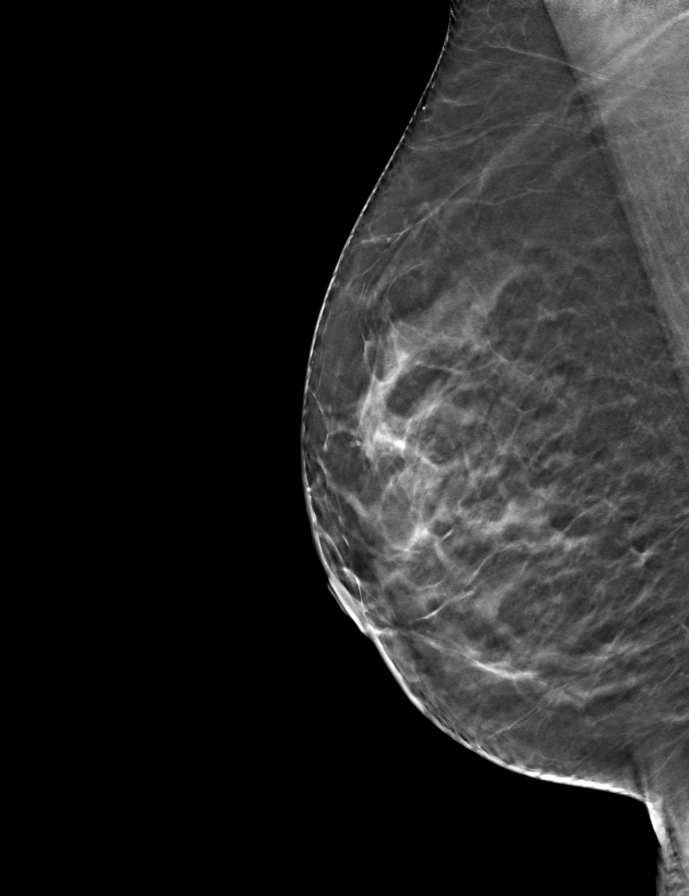

[R CC tomo · tomo slice 30/59.0]
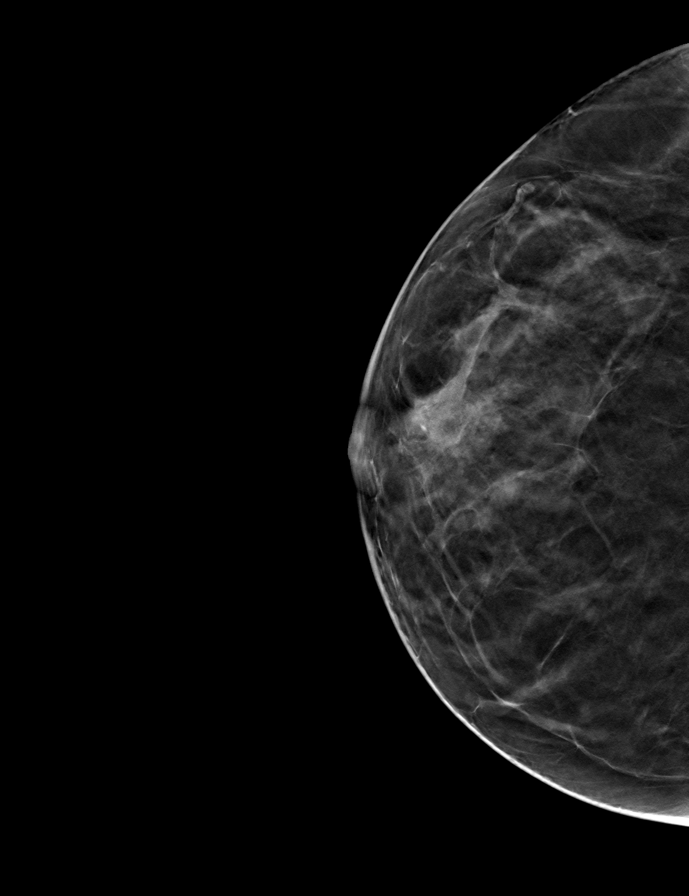

[L CC tomo · tomo slice 30/59.0]
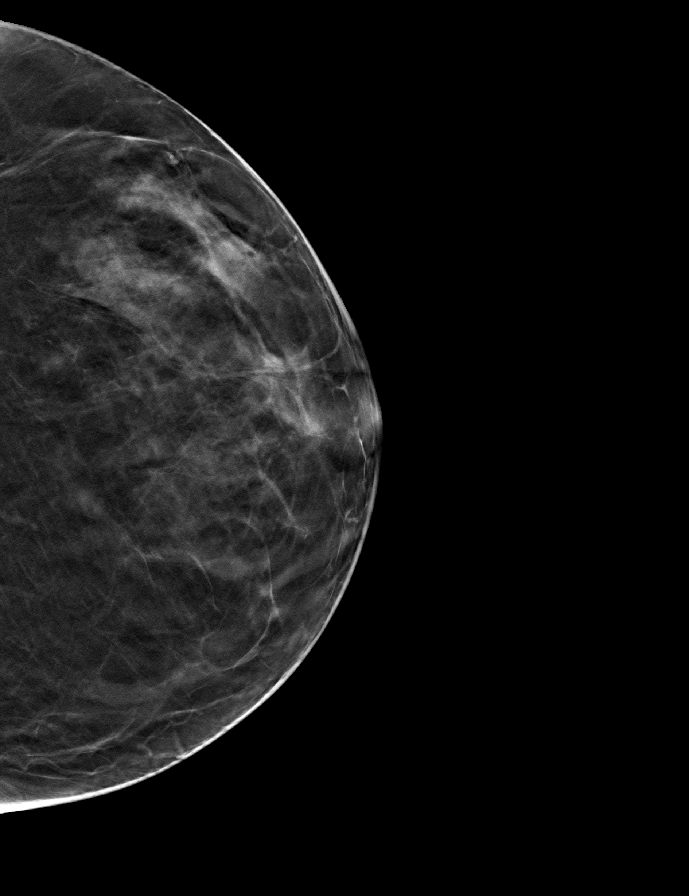

[L MLO tomo · tomo slice 30/59.0]
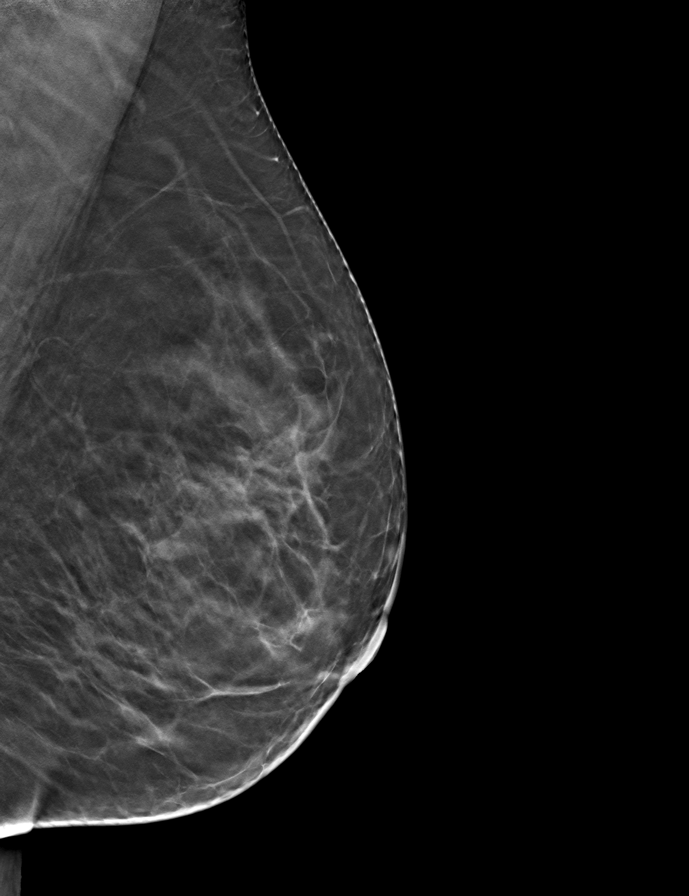

[9 of 24 positions shown; findings below may reference images not displayed]

ACR Breast Density Category c: The breast tissue is heterogeneously
dense, which may obscure small masses.
FINDINGS: There are no findings suspicious for malignancy. Images were
processed with CAD.
IMPRESSION: No mammographic evidence of malignancy. A result letter of this
screening mammogram will be mailed directly to the patient.

RECOMMENDATION:
Screening mammogram in one year. (Code:[id number])

BI-RADS CATEGORY  1: Negative.

## 2022-01-22 ENCOUNTER — Other Ambulatory Visit: Payer: Self-pay | Admitting: Nurse Practitioner

## 2022-01-22 DIAGNOSIS — Z1231 Encounter for screening mammogram for malignant neoplasm of breast: Secondary | ICD-10-CM

## 2022-02-05 ENCOUNTER — Ambulatory Visit (HOSPITAL_COMMUNITY)
Admission: RE | Admit: 2022-02-05 | Discharge: 2022-02-05 | Disposition: A | Discharge status: Home / Self Care | Payer: Self-pay | Source: Ambulatory Visit | Attending: Nurse Practitioner | Admitting: Nurse Practitioner

## 2022-02-05 DIAGNOSIS — Z1231 Encounter for screening mammogram for malignant neoplasm of breast: Secondary | ICD-10-CM | POA: Insufficient documentation

## 2022-12-08 ENCOUNTER — Other Ambulatory Visit: Payer: Self-pay | Admitting: Nurse Practitioner

## 2022-12-08 DIAGNOSIS — Z1231 Encounter for screening mammogram for malignant neoplasm of breast: Secondary | ICD-10-CM

## 2023-02-09 ENCOUNTER — Ambulatory Visit (HOSPITAL_COMMUNITY)
Admission: RE | Admit: 2023-02-09 | Discharge: 2023-02-09 | Disposition: A | Discharge status: Home / Self Care | Payer: Self-pay | Source: Ambulatory Visit | Attending: Nurse Practitioner | Admitting: Nurse Practitioner

## 2023-02-09 DIAGNOSIS — Z1231 Encounter for screening mammogram for malignant neoplasm of breast: Secondary | ICD-10-CM | POA: Insufficient documentation

## 2023-07-06 ENCOUNTER — Telehealth: Payer: Self-pay

## 2023-07-06 NOTE — Telephone Encounter (Signed)
 Attempted wellness/follow up call to [facility] client who speaks english per records. No answer, left message.   Next appointment at [facility] is scheduled for 08/07/23  Francee Nodal RN Clara Gunn/[facility]
